# Patient Record
Sex: Male | Born: 1950 | Marital: Married
Health system: Southern US, Community
[De-identification: ages and names within clinical notes are randomized; demographics above are authoritative.]

---

## 2012-05-17 ENCOUNTER — Inpatient Hospital Stay: Payer: Self-pay | Admitting: Internal Medicine

## 2012-05-17 LAB — COMPREHENSIVE METABOLIC PANEL
Albumin: 3.6 g/dL (ref 3.4–5.0)
Alkaline Phosphatase: 87 U/L (ref 50–136)
Bilirubin,Total: 1.1 mg/dL — ABNORMAL HIGH (ref 0.2–1.0)
Calcium, Total: 8.6 mg/dL (ref 8.5–10.1)
Co2: 21 mmol/L (ref 21–32)
EGFR (African American): >60
EGFR (Non-African Amer.): >60
Glucose: 152 mg/dL — ABNORMAL HIGH (ref 65–99)
SGOT(AST): 30 U/L (ref 15–37)
SGPT (ALT): 15 U/L (ref 12–78)

## 2012-05-17 LAB — CBC WITH DIFFERENTIAL/PLATELET
Basophil %: 0.2 %
Eosinophil %: 0 %
HCT: 44 % (ref 40.0–52.0)
HGB: 15.4 g/dL (ref 13.0–18.0)
Lymphocyte #: 0.2 10*3/uL — ABNORMAL LOW (ref 1.0–3.6)
Lymphocyte %: 1.9 %
MCV: 105 fL — ABNORMAL HIGH (ref 80–100)
Monocyte %: 2.4 %
Neutrophil #: 10.9 10*3/uL — ABNORMAL HIGH (ref 1.4–6.5)
RDW: 16.1 % — ABNORMAL HIGH (ref 11.5–14.5)
WBC: 11.4 10*3/uL — ABNORMAL HIGH (ref 3.8–10.6)

## 2012-05-17 LAB — LIPASE, BLOOD: Lipase: >3000 U/L (ref 73–393)

## 2012-05-18 LAB — CBC WITH DIFFERENTIAL/PLATELET
Basophil %: 0.6 %
Eosinophil %: 2.4 %
HCT: 38.7 % — ABNORMAL LOW (ref 40.0–52.0)
HGB: 13 g/dL (ref 13.0–18.0)
Lymphocyte %: 5.7 %
MCH: 36.2 pg — ABNORMAL HIGH (ref 26.0–34.0)
MCHC: 33.7 g/dL (ref 32.0–36.0)
MCV: 107 fL — ABNORMAL HIGH (ref 80–100)
Neutrophil #: 10.7 10*3/uL — ABNORMAL HIGH (ref 1.4–6.5)
RBC: 3.6 10*6/uL — ABNORMAL LOW (ref 4.40–5.90)
WBC: 12.1 10*3/uL — ABNORMAL HIGH (ref 3.8–10.6)

## 2012-05-18 LAB — CEA: CEA: 6.3 ng/mL — ABNORMAL HIGH (ref 0.0–4.7)

## 2012-05-18 LAB — BASIC METABOLIC PANEL
Anion Gap: 7 (ref 7–16)
BUN: 5 mg/dL — ABNORMAL LOW (ref 7–18)
Calcium, Total: 7.4 mg/dL — ABNORMAL LOW (ref 8.5–10.1)
Chloride: 109 mmol/L — ABNORMAL HIGH (ref 98–107)
Creatinine: 0.74 mg/dL (ref 0.60–1.30)
EGFR (African American): >60
Osmolality: 281 (ref 275–301)

## 2012-05-18 LAB — MAGNESIUM: Magnesium: 1.4 mg/dL — ABNORMAL LOW

## 2012-05-18 LAB — LIPASE, BLOOD: Lipase: 874 U/L — ABNORMAL HIGH (ref 73–393)

## 2012-05-19 LAB — HEPATIC FUNCTION PANEL A (ARMC)
Albumin: 2.5 g/dL — ABNORMAL LOW (ref 3.4–5.0)
Bilirubin, Direct: 0.3 mg/dL — ABNORMAL HIGH (ref 0.00–0.20)
Bilirubin,Total: 0.9 mg/dL (ref 0.2–1.0)
SGOT(AST): 23 U/L (ref 15–37)
Total Protein: 5.7 g/dL — ABNORMAL LOW (ref 6.4–8.2)

## 2012-05-19 LAB — LIPASE, BLOOD: Lipase: 165 U/L (ref 73–393)

## 2012-05-20 LAB — LIPASE, BLOOD: Lipase: 73 U/L (ref 73–393)

## 2012-05-23 LAB — CULTURE, BLOOD (SINGLE)

## 2012-05-25 LAB — PATHOLOGY REPORT

## 2012-07-20 ENCOUNTER — Emergency Department: Payer: Self-pay | Admitting: Emergency Medicine

## 2012-07-20 LAB — CBC
HCT: 44.2 %
HGB: 15.3 g/dL
MCH: 34.6 pg — ABNORMAL HIGH
MCHC: 34.6 g/dL
MCV: 100 fL
Platelet: 260 10*3/uL
RBC: 4.41 x10 6/mm 3
RDW: 15.3 % — ABNORMAL HIGH
WBC: 7 10*3/uL

## 2012-07-20 LAB — BASIC METABOLIC PANEL WITH GFR
Anion Gap: 10
BUN: 6 mg/dL — ABNORMAL LOW
Calcium, Total: 7.4 mg/dL — ABNORMAL LOW
Chloride: 103 mmol/L
Co2: 20 mmol/L — ABNORMAL LOW
Creatinine: 0.62 mg/dL
EGFR (African American): >60
EGFR (Non-African Amer.): >60
Glucose: 91 mg/dL
Osmolality: 264
Potassium: 3 mmol/L — ABNORMAL LOW
Sodium: 133 mmol/L — ABNORMAL LOW

## 2012-07-20 LAB — COMPREHENSIVE METABOLIC PANEL WITH GFR
Albumin: 3.7 g/dL
Alkaline Phosphatase: 116 U/L
Anion Gap: 12
BUN: 8 mg/dL
Bilirubin,Total: 1.8 mg/dL — ABNORMAL HIGH
Calcium, Total: 9 mg/dL
Chloride: 92 mmol/L — ABNORMAL LOW
Co2: 21 mmol/L
Creatinine: 0.72 mg/dL
EGFR (African American): >60
EGFR (Non-African Amer.): >60
Glucose: 113 mg/dL — ABNORMAL HIGH
Osmolality: 251
Potassium: 3.2 mmol/L — ABNORMAL LOW
SGOT(AST): 117 U/L — ABNORMAL HIGH
SGPT (ALT): 68 U/L
Sodium: 125 mmol/L — ABNORMAL LOW
Total Protein: 7.6 g/dL

## 2012-07-20 LAB — LIPASE, BLOOD: Lipase: 257 U/L (ref 73–393)

## 2012-07-20 LAB — URINALYSIS, COMPLETE
Bilirubin,UR: NEGATIVE
Blood: NEGATIVE
Hyaline Cast: 28
Leukocyte Esterase: NEGATIVE
Ph: 6 (ref 4.5–8.0)
Protein: 30
RBC,UR: 1 /HPF (ref 0–5)

## 2012-07-27 ENCOUNTER — Ambulatory Visit: Payer: Self-pay | Admitting: Anesthesiology

## 2012-08-05 ENCOUNTER — Other Ambulatory Visit: Payer: Self-pay | Admitting: Surgery

## 2012-08-05 LAB — COMPREHENSIVE METABOLIC PANEL
Albumin: 3.7 g/dL (ref 3.4–5.0)
BUN: 6 mg/dL — ABNORMAL LOW (ref 7–18)
Calcium, Total: 8.8 mg/dL (ref 8.5–10.1)
Chloride: 107 mmol/L (ref 98–107)
Creatinine: 0.61 mg/dL (ref 0.60–1.30)
EGFR (African American): >60
EGFR (Non-African Amer.): >60
SGOT(AST): 75 U/L — ABNORMAL HIGH (ref 15–37)
SGPT (ALT): 40 U/L (ref 12–78)
Sodium: 138 mmol/L (ref 136–145)
Total Protein: 7.5 g/dL (ref 6.4–8.2)

## 2012-08-05 LAB — LIPASE, BLOOD: Lipase: 1123 U/L — ABNORMAL HIGH (ref 73–393)

## 2012-08-05 LAB — PHOSPHORUS: Phosphorus: 3.1 mg/dL (ref 2.5–4.9)

## 2012-08-28 ENCOUNTER — Ambulatory Visit: Payer: Self-pay | Admitting: Anesthesiology

## 2012-08-28 ENCOUNTER — Other Ambulatory Visit: Payer: Self-pay | Admitting: Gastroenterology

## 2012-08-28 LAB — WBCS, STOOL

## 2012-08-28 LAB — CLOSTRIDIUM DIFFICILE BY PCR

## 2012-08-30 LAB — STOOL CULTURE

## 2012-09-11 ENCOUNTER — Ambulatory Visit: Payer: Self-pay | Admitting: Gastroenterology

## 2012-09-14 LAB — PATHOLOGY REPORT

## 2012-11-17 ENCOUNTER — Inpatient Hospital Stay: Payer: Self-pay | Admitting: Internal Medicine

## 2012-11-17 LAB — COMPREHENSIVE METABOLIC PANEL
Alkaline Phosphatase: 240 U/L — ABNORMAL HIGH (ref 50–136)
BUN: 10 mg/dL (ref 7–18)
Bilirubin,Total: 3 mg/dL — ABNORMAL HIGH (ref 0.2–1.0)
Calcium, Total: 7.2 mg/dL — ABNORMAL LOW (ref 8.5–10.1)
Chloride: 92 mmol/L — ABNORMAL LOW (ref 98–107)
Creatinine: 0.92 mg/dL (ref 0.60–1.30)
EGFR (Non-African Amer.): >60
Potassium: 2.8 mmol/L — ABNORMAL LOW (ref 3.5–5.1)
SGOT(AST): 61 U/L — ABNORMAL HIGH (ref 15–37)
SGPT (ALT): 44 U/L (ref 12–78)

## 2012-11-17 LAB — URINALYSIS, COMPLETE
Bacteria: NONE SEEN
Bilirubin,UR: NEGATIVE
Glucose,UR: NEGATIVE mg/dL (ref 0–75)
Hyaline Cast: 30
Ketone: NEGATIVE
Leukocyte Esterase: NEGATIVE
Nitrite: NEGATIVE
Protein: NEGATIVE
RBC,UR: 3 /HPF (ref 0–5)
Squamous Epithelial: 1

## 2012-11-17 LAB — CBC
MCH: 38.9 pg — ABNORMAL HIGH (ref 26.0–34.0)
MCV: 116 fL — ABNORMAL HIGH (ref 80–100)
Platelet: 279 10*3/uL (ref 150–440)
RDW: 20 % — ABNORMAL HIGH (ref 11.5–14.5)
WBC: 26.8 10*3/uL — ABNORMAL HIGH (ref 3.8–10.6)

## 2012-11-17 LAB — ACETAMINOPHEN LEVEL: Acetaminophen: 29 ug/mL

## 2012-11-17 LAB — LIPASE, BLOOD: Lipase: 1417 U/L — ABNORMAL HIGH (ref 73–393)

## 2012-11-18 DIAGNOSIS — R0602 Shortness of breath: Secondary | ICD-10-CM

## 2012-11-18 LAB — CBC WITH DIFFERENTIAL/PLATELET
Basophil %: 0.2 %
Eosinophil #: 0.3 10*3/uL (ref 0.0–0.7)
Lymphocyte #: 1.1 10*3/uL (ref 1.0–3.6)
MCH: 39.3 pg — ABNORMAL HIGH (ref 26.0–34.0)
MCHC: 33.6 g/dL (ref 32.0–36.0)
MCV: 117 fL — ABNORMAL HIGH (ref 80–100)
Monocyte #: 0.6 x10 3/mm (ref 0.2–1.0)
Neutrophil #: 21.8 10*3/uL — ABNORMAL HIGH (ref 1.4–6.5)
Neutrophil %: 91.3 %
Platelet: 236 10*3/uL (ref 150–440)
RDW: 20.1 % — ABNORMAL HIGH (ref 11.5–14.5)
WBC: 23.9 10*3/uL — ABNORMAL HIGH (ref 3.8–10.6)

## 2012-11-18 LAB — COMPREHENSIVE METABOLIC PANEL
Alkaline Phosphatase: 219 U/L — ABNORMAL HIGH (ref 50–136)
Anion Gap: 8 (ref 7–16)
BUN: 11 mg/dL (ref 7–18)
Bilirubin,Total: 2.5 mg/dL — ABNORMAL HIGH (ref 0.2–1.0)
Calcium, Total: 6.4 mg/dL — CL (ref 8.5–10.1)
Chloride: 98 mmol/L (ref 98–107)
Co2: 24 mmol/L (ref 21–32)
Creatinine: 1.27 mg/dL (ref 0.60–1.30)
EGFR (African American): >60
EGFR (Non-African Amer.): >60
SGOT(AST): 50 U/L — ABNORMAL HIGH (ref 15–37)
SGPT (ALT): 39 U/L (ref 12–78)
Sodium: 130 mmol/L — ABNORMAL LOW (ref 136–145)

## 2012-11-19 DIAGNOSIS — I959 Hypotension, unspecified: Secondary | ICD-10-CM

## 2012-11-19 LAB — BASIC METABOLIC PANEL
Anion Gap: 10 (ref 7–16)
BUN: 11 mg/dL (ref 7–18)
Creatinine: 1.27 mg/dL (ref 0.60–1.30)
EGFR (African American): >60
EGFR (Non-African Amer.): >60
Glucose: 48 mg/dL — ABNORMAL LOW (ref 65–99)
Potassium: 4.2 mmol/L (ref 3.5–5.1)

## 2012-11-19 LAB — CBC WITH DIFFERENTIAL/PLATELET
Bands: 4 %
HCT: 29 % — ABNORMAL LOW (ref 40.0–52.0)
HGB: 9.2 g/dL — ABNORMAL LOW (ref 13.0–18.0)
Lymphocytes: 4 %
MCHC: 31.7 g/dL — ABNORMAL LOW (ref 32.0–36.0)
MCV: 123 fL — ABNORMAL HIGH (ref 80–100)
Monocytes: 6 %
NRBC/100 WBC: 1 /
RBC: 2.35 10*6/uL — ABNORMAL LOW (ref 4.40–5.90)
Segmented Neutrophils: 86 %
WBC: 17.9 10*3/uL — ABNORMAL HIGH (ref 3.8–10.6)

## 2012-11-19 LAB — PRO B NATRIURETIC PEPTIDE: B-Type Natriuretic Peptide: 957 pg/mL — ABNORMAL HIGH (ref 0–125)

## 2012-11-19 LAB — HEPATIC FUNCTION PANEL A (ARMC)
SGOT(AST): 51 U/L — ABNORMAL HIGH (ref 15–37)
Total Protein: 4.2 g/dL — ABNORMAL LOW (ref 6.4–8.2)

## 2012-11-19 LAB — MAGNESIUM: Magnesium: 2.1 mg/dL

## 2012-11-19 LAB — TROPONIN I: Troponin-I: <0.02 ng/mL

## 2012-11-19 LAB — LIPASE, BLOOD: Lipase: 270 U/L (ref 73–393)

## 2012-11-19 LAB — CK TOTAL AND CKMB (NOT AT ARMC): CK-MB: 3.4 ng/mL (ref 0.5–3.6)

## 2012-11-20 LAB — BASIC METABOLIC PANEL
Anion Gap: 6 — ABNORMAL LOW (ref 7–16)
BUN: 13 mg/dL (ref 7–18)
Calcium, Total: 6.3 mg/dL — CL (ref 8.5–10.1)
Chloride: 106 mmol/L (ref 98–107)
Co2: 21 mmol/L (ref 21–32)
EGFR (Non-African Amer.): 60 — ABNORMAL LOW
Glucose: 115 mg/dL — ABNORMAL HIGH (ref 65–99)
Potassium: 3.8 mmol/L (ref 3.5–5.1)
Sodium: 133 mmol/L — ABNORMAL LOW (ref 136–145)

## 2012-11-20 LAB — CBC WITH DIFFERENTIAL/PLATELET
Bands: 5 %
HCT: 27.7 % — ABNORMAL LOW (ref 40.0–52.0)
HGB: 9.1 g/dL — ABNORMAL LOW (ref 13.0–18.0)
MCH: 40.1 pg — ABNORMAL HIGH (ref 26.0–34.0)
MCHC: 32.7 g/dL (ref 32.0–36.0)
MCV: 123 fL — ABNORMAL HIGH (ref 80–100)
Metamyelocyte: 2 %
Monocytes: 2 %
Platelet: 235 10*3/uL (ref 150–440)
RDW: 20.5 % — ABNORMAL HIGH (ref 11.5–14.5)
Segmented Neutrophils: 86 %

## 2012-11-20 LAB — TROPONIN I
Troponin-I: <0.02 ng/mL
Troponin-I: <0.02 ng/mL

## 2012-11-20 LAB — CK TOTAL AND CKMB (NOT AT ARMC)
CK, Total: 57 U/L (ref 35–232)
CK, Total: 83 U/L (ref 35–232)
CK-MB: 1.6 ng/mL (ref 0.5–3.6)

## 2012-11-20 LAB — PHOSPHORUS: Phosphorus: 2.6 mg/dL (ref 2.5–4.9)

## 2012-11-20 LAB — CANCER ANTIGEN 19-9: CA 19-9: 111 U/mL — ABNORMAL HIGH (ref 0–35)

## 2012-11-21 LAB — CBC WITH DIFFERENTIAL/PLATELET
Basophil #: 0 10*3/uL (ref 0.0–0.1)
Basophil %: 0.1 %
Eosinophil #: 0 10*3/uL (ref 0.0–0.7)
HCT: 29.4 % — ABNORMAL LOW (ref 40.0–52.0)
HGB: 9.6 g/dL — ABNORMAL LOW (ref 13.0–18.0)
Lymphocyte #: 0.7 10*3/uL — ABNORMAL LOW (ref 1.0–3.6)
MCV: 122 fL — ABNORMAL HIGH (ref 80–100)
Monocyte #: 0.8 x10 3/mm (ref 0.2–1.0)
Monocyte %: 4.2 %
Neutrophil %: 92.4 %
Platelet: 241 10*3/uL (ref 150–440)
RDW: 21.7 % — ABNORMAL HIGH (ref 11.5–14.5)
WBC: 20.3 10*3/uL — ABNORMAL HIGH (ref 3.8–10.6)

## 2012-11-21 LAB — COMPREHENSIVE METABOLIC PANEL
Albumin: 1.1 g/dL — ABNORMAL LOW (ref 3.4–5.0)
Alkaline Phosphatase: 173 U/L — ABNORMAL HIGH (ref 50–136)
Anion Gap: 7 (ref 7–16)
BUN: 12 mg/dL (ref 7–18)
Bilirubin,Total: 1.7 mg/dL — ABNORMAL HIGH (ref 0.2–1.0)
Chloride: 106 mmol/L (ref 98–107)
EGFR (African American): >60
EGFR (Non-African Amer.): >60
Osmolality: 272 (ref 275–301)
Potassium: 4.2 mmol/L (ref 3.5–5.1)
SGOT(AST): 37 U/L (ref 15–37)
SGPT (ALT): 26 U/L (ref 12–78)
Sodium: 135 mmol/L — ABNORMAL LOW (ref 136–145)

## 2012-11-21 LAB — TRIGLYCERIDES: Triglycerides: 191 mg/dL (ref 0–200)

## 2012-11-22 LAB — BASIC METABOLIC PANEL
BUN: 13 mg/dL (ref 7–18)
Co2: 23 mmol/L (ref 21–32)
EGFR (African American): >60
EGFR (Non-African Amer.): >60
Osmolality: 274 (ref 275–301)
Potassium: 3.4 mmol/L — ABNORMAL LOW (ref 3.5–5.1)
Sodium: 137 mmol/L (ref 136–145)

## 2012-11-22 LAB — CBC WITH DIFFERENTIAL/PLATELET
Bands: 3 %
HCT: 24.2 % — ABNORMAL LOW (ref 40.0–52.0)
MCHC: 33.1 g/dL (ref 32.0–36.0)
Metamyelocyte: 1 %
Platelet: 185 10*3/uL (ref 150–440)

## 2012-11-22 LAB — POTASSIUM: Potassium: 4 mmol/L (ref 3.5–5.1)

## 2012-11-23 LAB — BASIC METABOLIC PANEL
BUN: 21 mg/dL — ABNORMAL HIGH (ref 7–18)
Calcium, Total: 7.4 mg/dL — ABNORMAL LOW (ref 8.5–10.1)
Chloride: 106 mmol/L (ref 98–107)
Co2: 25 mmol/L (ref 21–32)
Creatinine: 0.63 mg/dL (ref 0.60–1.30)
EGFR (African American): >60
EGFR (Non-African Amer.): >60
Osmolality: 279 (ref 275–301)
Potassium: 3.8 mmol/L (ref 3.5–5.1)
Sodium: 138 mmol/L (ref 136–145)

## 2012-11-23 LAB — CBC WITH DIFFERENTIAL/PLATELET
Eosinophil: 1 %
HCT: 26.5 % — ABNORMAL LOW (ref 40.0–52.0)
HGB: 8.8 g/dL — ABNORMAL LOW (ref 13.0–18.0)
MCHC: 33.1 g/dL (ref 32.0–36.0)
Metamyelocyte: 1 %
Platelet: 216 10*3/uL (ref 150–440)
RBC: 2.18 10*6/uL — ABNORMAL LOW (ref 4.40–5.90)
Segmented Neutrophils: 87 %
WBC: 13 10*3/uL — ABNORMAL HIGH (ref 3.8–10.6)

## 2012-11-23 LAB — HEPATIC FUNCTION PANEL A (ARMC)
Albumin: 1.5 g/dL — ABNORMAL LOW (ref 3.4–5.0)
Bilirubin, Direct: 0.9 mg/dL — ABNORMAL HIGH (ref 0.00–0.20)
SGPT (ALT): 36 U/L (ref 12–78)
Total Protein: 4.7 g/dL — ABNORMAL LOW (ref 6.4–8.2)

## 2012-11-23 LAB — CULTURE, BLOOD (SINGLE)

## 2012-11-24 LAB — CBC WITH DIFFERENTIAL/PLATELET
Bands: 1 %
HCT: 27 % — ABNORMAL LOW (ref 40.0–52.0)
MCH: 39.4 pg — ABNORMAL HIGH (ref 26.0–34.0)
MCHC: 32.5 g/dL (ref 32.0–36.0)
MCV: 121 fL — ABNORMAL HIGH (ref 80–100)
Monocytes: 6 %
Platelet: 211 10*3/uL (ref 150–440)
RDW: 20.7 % — ABNORMAL HIGH (ref 11.5–14.5)
Segmented Neutrophils: 87 %
WBC: 12.3 10*3/uL — ABNORMAL HIGH (ref 3.8–10.6)

## 2012-11-24 LAB — TRIGLYCERIDES: Triglycerides: 217 mg/dL — ABNORMAL HIGH (ref 0–200)

## 2012-11-24 LAB — BASIC METABOLIC PANEL
Anion Gap: 8 (ref 7–16)
Co2: 25 mmol/L (ref 21–32)
EGFR (African American): >60
Osmolality: 283 (ref 275–301)
Sodium: 139 mmol/L (ref 136–145)

## 2012-11-25 LAB — BASIC METABOLIC PANEL
BUN: 20 mg/dL — ABNORMAL HIGH (ref 7–18)
Calcium, Total: 7.4 mg/dL — ABNORMAL LOW (ref 8.5–10.1)
Chloride: 105 mmol/L (ref 98–107)
EGFR (African American): >60
EGFR (Non-African Amer.): >60
Sodium: 141 mmol/L (ref 136–145)

## 2012-11-25 LAB — URINALYSIS, COMPLETE
Bacteria: NONE SEEN
Bilirubin,UR: NEGATIVE
Blood: NEGATIVE
Glucose,UR: NEGATIVE mg/dL (ref 0–75)
Leukocyte Esterase: NEGATIVE
Nitrite: NEGATIVE
Specific Gravity: 1.014 (ref 1.003–1.030)
WBC UR: NONE SEEN /HPF (ref 0–5)

## 2012-11-25 LAB — CBC WITH DIFFERENTIAL/PLATELET
Basophil %: 0.5 %
Eosinophil #: 0 10*3/uL (ref 0.0–0.7)
HCT: 25 % — ABNORMAL LOW (ref 40.0–52.0)
HGB: 8.5 g/dL — ABNORMAL LOW (ref 13.0–18.0)
Lymphocyte #: 1 10*3/uL (ref 1.0–3.6)
Lymphocyte %: 4.6 %
MCH: 40.8 pg — ABNORMAL HIGH (ref 26.0–34.0)
MCHC: 33.9 g/dL (ref 32.0–36.0)
Monocyte #: 0.9 x10 3/mm (ref 0.2–1.0)
Monocyte %: 4.4 %
Platelet: 209 10*3/uL (ref 150–440)
RBC: 2.08 10*6/uL — ABNORMAL LOW (ref 4.40–5.90)
WBC: 21.2 10*3/uL — ABNORMAL HIGH (ref 3.8–10.6)

## 2012-11-26 LAB — CBC WITH DIFFERENTIAL/PLATELET
Basophil #: 0.1 10*3/uL (ref 0.0–0.1)
Eosinophil #: 0.1 10*3/uL (ref 0.0–0.7)
Eosinophil %: 0.3 %
HCT: 28.1 % — ABNORMAL LOW (ref 40.0–52.0)
Lymphocyte #: 1.7 10*3/uL (ref 1.0–3.6)
MCHC: 33.2 g/dL (ref 32.0–36.0)
MCV: 120 fL — ABNORMAL HIGH (ref 80–100)
Neutrophil #: 24.4 10*3/uL — ABNORMAL HIGH (ref 1.4–6.5)
Neutrophil %: 89.1 %
RBC: 2.34 10*6/uL — ABNORMAL LOW (ref 4.40–5.90)
WBC: 27.4 10*3/uL — ABNORMAL HIGH (ref 3.8–10.6)

## 2012-11-26 LAB — ALBUMIN: Albumin: 2 g/dL — ABNORMAL LOW (ref 3.4–5.0)

## 2012-11-26 LAB — BASIC METABOLIC PANEL
Anion Gap: 6 — ABNORMAL LOW (ref 7–16)
Calcium, Total: 7.5 mg/dL — ABNORMAL LOW (ref 8.5–10.1)
EGFR (African American): >60
EGFR (Non-African Amer.): >60
Glucose: 103 mg/dL — ABNORMAL HIGH (ref 65–99)
Osmolality: 282 (ref 275–301)
Potassium: 2.8 mmol/L — ABNORMAL LOW (ref 3.5–5.1)

## 2012-11-27 LAB — CBC WITH DIFFERENTIAL/PLATELET
HCT: 26.2 % — ABNORMAL LOW (ref 40.0–52.0)
Lymphocyte #: 1.9 10*3/uL (ref 1.0–3.6)
Lymphocyte %: 8.5 %
Monocyte #: 0.8 x10 3/mm (ref 0.2–1.0)
Neutrophil %: 86.1 %
Platelet: 206 10*3/uL (ref 150–440)
RDW: 19.1 % — ABNORMAL HIGH (ref 11.5–14.5)
WBC: 22.8 10*3/uL — ABNORMAL HIGH (ref 3.8–10.6)

## 2012-11-27 LAB — BASIC METABOLIC PANEL
BUN: 9 mg/dL (ref 7–18)
Calcium, Total: 7.5 mg/dL — ABNORMAL LOW (ref 8.5–10.1)
Chloride: 101 mmol/L (ref 98–107)
Co2: 35 mmol/L — ABNORMAL HIGH (ref 21–32)
Creatinine: 0.43 mg/dL — ABNORMAL LOW (ref 0.60–1.30)
EGFR (African American): >60
EGFR (Non-African Amer.): >60
Potassium: 3.7 mmol/L (ref 3.5–5.1)
Sodium: 142 mmol/L (ref 136–145)

## 2012-11-27 LAB — PHOSPHORUS: Phosphorus: 1.6 mg/dL — ABNORMAL LOW (ref 2.5–4.9)

## 2012-11-28 LAB — CBC WITH DIFFERENTIAL/PLATELET
Bands: 3 %
HCT: 25.6 % — ABNORMAL LOW (ref 40.0–52.0)
Lymphocytes: 4 %
MCH: 38.2 pg — ABNORMAL HIGH (ref 26.0–34.0)
MCV: 121 fL — ABNORMAL HIGH (ref 80–100)
Monocytes: 5 %
Myelocyte: 1 %
RDW: 19.2 % — ABNORMAL HIGH (ref 11.5–14.5)
Segmented Neutrophils: 83 %

## 2012-11-28 LAB — PHOSPHORUS: Phosphorus: 2.5 mg/dL

## 2012-11-29 LAB — PHOSPHORUS: Phosphorus: 2.6 mg/dL (ref 2.5–4.9)

## 2012-12-08 ENCOUNTER — Ambulatory Visit: Payer: Self-pay | Admitting: Internal Medicine

## 2012-12-22 ENCOUNTER — Inpatient Hospital Stay: Payer: Self-pay | Admitting: Internal Medicine

## 2012-12-22 LAB — CBC WITH DIFFERENTIAL/PLATELET
Basophil %: 0.5 %
Eosinophil %: 0 %
HCT: 26.3 % — ABNORMAL LOW (ref 40.0–52.0)
HGB: 8.8 g/dL — ABNORMAL LOW (ref 13.0–18.0)
Lymphocyte #: 1.6 10*3/uL (ref 1.0–3.6)
MCH: 37.2 pg — ABNORMAL HIGH (ref 26.0–34.0)
MCHC: 33.4 g/dL (ref 32.0–36.0)
MCV: 112 fL — ABNORMAL HIGH (ref 80–100)
Monocyte #: 1.4 x10 3/mm — ABNORMAL HIGH (ref 0.2–1.0)
Neutrophil #: 49.9 10*3/uL — ABNORMAL HIGH (ref 1.4–6.5)
RBC: 2.36 10*6/uL — ABNORMAL LOW (ref 4.40–5.90)
RDW: 17.2 % — ABNORMAL HIGH (ref 11.5–14.5)

## 2012-12-22 LAB — COMPREHENSIVE METABOLIC PANEL
Albumin: 1.1 g/dL — ABNORMAL LOW (ref 3.4–5.0)
Alkaline Phosphatase: 94 U/L (ref 50–136)
BUN: 9 mg/dL (ref 7–18)
Bilirubin,Total: 0.4 mg/dL (ref 0.2–1.0)
Calcium, Total: 6.4 mg/dL — CL (ref 8.5–10.1)
Creatinine: 0.96 mg/dL (ref 0.60–1.30)
EGFR (African American): >60
Glucose: 136 mg/dL — ABNORMAL HIGH (ref 65–99)
Osmolality: 262 (ref 275–301)
Potassium: 3.2 mmol/L — ABNORMAL LOW (ref 3.5–5.1)
SGOT(AST): 19 U/L (ref 15–37)
SGPT (ALT): 14 U/L (ref 12–78)

## 2012-12-22 LAB — APTT: Activated PTT: 38.1 secs — ABNORMAL HIGH (ref 23.6–35.9)

## 2012-12-22 LAB — PROTIME-INR
INR: 1.7
Prothrombin Time: 19.5 secs — ABNORMAL HIGH (ref 11.5–14.7)

## 2012-12-22 LAB — LIPASE, BLOOD: Lipase: 126 U/L (ref 73–393)

## 2012-12-23 LAB — BODY FLUID CELL COUNT WITH DIFFERENTIAL
Basophil: 0 %
Eosinophil: 0 %
Neutrophils: 92 %
Nucleated Cell Count: 706 /mm3
Other Mononuclear Cells: 5 %

## 2012-12-23 LAB — URINALYSIS, COMPLETE
Blood: NEGATIVE
Hyaline Cast: 18
Leukocyte Esterase: NEGATIVE
Nitrite: NEGATIVE
Squamous Epithelial: NONE SEEN
WBC UR: 1 /HPF (ref 0–5)

## 2012-12-23 LAB — CBC WITH DIFFERENTIAL/PLATELET
Bands: 8 %
HCT: 30.1 % — ABNORMAL LOW (ref 40.0–52.0)
HGB: 9.8 g/dL — ABNORMAL LOW (ref 13.0–18.0)
Lymphocytes: 2 %
MCV: 109 fL — ABNORMAL HIGH (ref 80–100)
Monocytes: 5 %
Platelet: 341 10*3/uL (ref 150–440)
RBC: 2.75 10*6/uL — ABNORMAL LOW (ref 4.40–5.90)
RDW: 17.1 % — ABNORMAL HIGH (ref 11.5–14.5)
Segmented Neutrophils: 85 %
WBC: 58.6 10*3/uL — ABNORMAL HIGH (ref 3.8–10.6)

## 2012-12-23 LAB — COMPREHENSIVE METABOLIC PANEL
Alkaline Phosphatase: 117 U/L (ref 50–136)
Anion Gap: 9 (ref 7–16)
BUN: 8 mg/dL (ref 7–18)
Bilirubin,Total: 0.5 mg/dL (ref 0.2–1.0)
Chloride: 96 mmol/L — ABNORMAL LOW (ref 98–107)
Co2: 25 mmol/L (ref 21–32)
Creatinine: 0.9 mg/dL (ref 0.60–1.30)
EGFR (African American): >60
Glucose: 70 mg/dL (ref 65–99)
Osmolality: 258 (ref 275–301)
Potassium: 3.6 mmol/L (ref 3.5–5.1)
SGPT (ALT): 16 U/L (ref 12–78)
Sodium: 130 mmol/L — ABNORMAL LOW (ref 136–145)
Total Protein: 4.3 g/dL — ABNORMAL LOW (ref 6.4–8.2)

## 2012-12-23 LAB — PROTEIN, BODY FLUID: Protein, Body Fluid: 1.2 g/dL

## 2012-12-23 LAB — LACTATE DEHYDROGENASE, PLEURAL OR PERITONEAL FLUID: LDH, Body Fluid: 88 U/L

## 2012-12-23 LAB — ALBUMIN, FLUID (OTHER): Body Fluid Albumin: <0.6 g/dL

## 2012-12-23 LAB — AMYLASE, BODY FLUID: Amylase, Body Fluid: 267 U/L

## 2012-12-24 LAB — VANCOMYCIN, TROUGH: Vancomycin, Trough: 12 ug/mL (ref 10–20)

## 2012-12-24 LAB — COMPREHENSIVE METABOLIC PANEL
Albumin: 2.3 g/dL — ABNORMAL LOW (ref 3.4–5.0)
Alkaline Phosphatase: 86 U/L (ref 50–136)
Anion Gap: 6 — ABNORMAL LOW (ref 7–16)
Bilirubin,Total: 0.7 mg/dL (ref 0.2–1.0)
Calcium, Total: 7.2 mg/dL — ABNORMAL LOW (ref 8.5–10.1)
EGFR (African American): >60
EGFR (Non-African Amer.): >60
Glucose: 77 mg/dL (ref 65–99)
SGOT(AST): 21 U/L (ref 15–37)
Sodium: 130 mmol/L — ABNORMAL LOW (ref 136–145)

## 2012-12-24 LAB — CBC WITH DIFFERENTIAL/PLATELET
Basophil #: 0.3 10*3/uL — ABNORMAL HIGH (ref 0.0–0.1)
Eosinophil %: 0.1 %
HCT: 23.6 % — ABNORMAL LOW (ref 40.0–52.0)
Lymphocyte %: 5.2 %
MCH: 36.5 pg — ABNORMAL HIGH (ref 26.0–34.0)
MCHC: 33.8 g/dL (ref 32.0–36.0)
MCV: 108 fL — ABNORMAL HIGH (ref 80–100)
Monocyte %: 4.1 %
Neutrophil #: 24.8 10*3/uL — ABNORMAL HIGH (ref 1.4–6.5)
Neutrophil %: 89.6 %
Platelet: 237 10*3/uL (ref 150–440)
WBC: 27.7 10*3/uL — ABNORMAL HIGH (ref 3.8–10.6)

## 2012-12-25 LAB — CBC WITH DIFFERENTIAL/PLATELET
Basophil #: 0.2 10*3/uL — ABNORMAL HIGH (ref 0.0–0.1)
Basophil %: 0.7 %
Eosinophil #: 0.1 10*3/uL (ref 0.0–0.7)
Eosinophil %: 0.3 %
HCT: 27 % — ABNORMAL LOW (ref 40.0–52.0)
HGB: 9 g/dL — ABNORMAL LOW (ref 13.0–18.0)
Lymphocyte #: 1.3 10*3/uL (ref 1.0–3.6)
MCH: 35.8 pg — ABNORMAL HIGH (ref 26.0–34.0)
Monocyte %: 3.2 %
Platelet: 209 10*3/uL (ref 150–440)
RBC: 2.51 10*6/uL — ABNORMAL LOW (ref 4.40–5.90)
WBC: 26.3 10*3/uL — ABNORMAL HIGH (ref 3.8–10.6)

## 2012-12-25 LAB — COMPREHENSIVE METABOLIC PANEL
Albumin: 1.8 g/dL — ABNORMAL LOW (ref 3.4–5.0)
Alkaline Phosphatase: 100 U/L (ref 50–136)
Anion Gap: 6 — ABNORMAL LOW (ref 7–16)
BUN: 4 mg/dL — ABNORMAL LOW (ref 7–18)
Bilirubin,Total: 1.1 mg/dL — ABNORMAL HIGH (ref 0.2–1.0)
Calcium, Total: 7.2 mg/dL — ABNORMAL LOW (ref 8.5–10.1)
Creatinine: 0.57 mg/dL — ABNORMAL LOW (ref 0.60–1.30)
Glucose: 81 mg/dL (ref 65–99)
Osmolality: 257 (ref 275–301)
SGOT(AST): 20 U/L (ref 15–37)
Sodium: 130 mmol/L — ABNORMAL LOW (ref 136–145)
Total Protein: 4.2 g/dL — ABNORMAL LOW (ref 6.4–8.2)

## 2012-12-25 LAB — CLOSTRIDIUM DIFFICILE BY PCR

## 2012-12-26 LAB — CBC WITH DIFFERENTIAL/PLATELET
Basophil #: 0.1 10*3/uL (ref 0.0–0.1)
Basophil %: 0.2 %
Lymphocyte #: 1.6 10*3/uL (ref 1.0–3.6)
Lymphocyte %: 7.1 %
MCHC: 34.3 g/dL (ref 32.0–36.0)
MCV: 107 fL — ABNORMAL HIGH (ref 80–100)
Monocyte #: 1.3 x10 3/mm — ABNORMAL HIGH (ref 0.2–1.0)
Monocyte %: 6 %
Neutrophil %: 86.1 %
Platelet: 181 10*3/uL (ref 150–440)
WBC: 22 10*3/uL — ABNORMAL HIGH (ref 3.8–10.6)

## 2012-12-26 LAB — BASIC METABOLIC PANEL
Anion Gap: 6 — ABNORMAL LOW (ref 7–16)
BUN: 2 mg/dL — ABNORMAL LOW (ref 7–18)
Calcium, Total: 7.2 mg/dL — ABNORMAL LOW (ref 8.5–10.1)
Chloride: 98 mmol/L (ref 98–107)
Creatinine: 0.46 mg/dL — ABNORMAL LOW (ref 0.60–1.30)
EGFR (African American): >60
Potassium: 3.3 mmol/L — ABNORMAL LOW (ref 3.5–5.1)
Sodium: 132 mmol/L — ABNORMAL LOW (ref 136–145)

## 2012-12-26 LAB — AMMONIA: Ammonia, Plasma: 41 mcmol/L — ABNORMAL HIGH (ref 11–32)

## 2012-12-27 LAB — CBC WITH DIFFERENTIAL/PLATELET
Basophil #: 0.1 10*3/uL (ref 0.0–0.1)
Eosinophil #: 0.2 10*3/uL (ref 0.0–0.7)
Eosinophil %: 0.9 %
HCT: 24.8 % — ABNORMAL LOW (ref 40.0–52.0)
HGB: 8.3 g/dL — ABNORMAL LOW (ref 13.0–18.0)
Lymphocyte #: 1.6 10*3/uL (ref 1.0–3.6)
MCV: 107 fL — ABNORMAL HIGH (ref 80–100)
Neutrophil #: 17.9 10*3/uL — ABNORMAL HIGH (ref 1.4–6.5)
Neutrophil %: 83.8 %
Platelet: 178 10*3/uL (ref 150–440)
WBC: 21.4 10*3/uL — ABNORMAL HIGH (ref 3.8–10.6)

## 2012-12-27 LAB — BASIC METABOLIC PANEL
BUN: 2 mg/dL — ABNORMAL LOW (ref 7–18)
Calcium, Total: 7.2 mg/dL — ABNORMAL LOW (ref 8.5–10.1)
Chloride: 99 mmol/L (ref 98–107)
EGFR (African American): >60
Glucose: 82 mg/dL (ref 65–99)
Osmolality: 264 (ref 275–301)

## 2012-12-27 LAB — BODY FLUID CULTURE

## 2012-12-28 LAB — CBC WITH DIFFERENTIAL/PLATELET
Basophil #: 0.1 10*3/uL (ref 0.0–0.1)
Basophil %: 0.3 %
Eosinophil #: 0.1 10*3/uL (ref 0.0–0.7)
HCT: 27 % — ABNORMAL LOW (ref 40.0–52.0)
HGB: 9.1 g/dL — ABNORMAL LOW (ref 13.0–18.0)
Lymphocyte #: 1.1 10*3/uL (ref 1.0–3.6)
Lymphocyte %: 5.5 %
Monocyte #: 1.3 x10 3/mm — ABNORMAL HIGH (ref 0.2–1.0)
Neutrophil #: 17.5 10*3/uL — ABNORMAL HIGH (ref 1.4–6.5)
Neutrophil %: 87.6 %
Platelet: 196 10*3/uL (ref 150–440)
RBC: 2.52 10*6/uL — ABNORMAL LOW (ref 4.40–5.90)
RDW: 18.2 % — ABNORMAL HIGH (ref 11.5–14.5)
WBC: 20 10*3/uL — ABNORMAL HIGH (ref 3.8–10.6)

## 2012-12-28 LAB — PROTIME-INR: Prothrombin Time: 18.9 secs — ABNORMAL HIGH (ref 11.5–14.7)

## 2012-12-28 LAB — CULTURE, BLOOD (SINGLE)

## 2012-12-29 LAB — BASIC METABOLIC PANEL
Anion Gap: 7 (ref 7–16)
BUN: 6 mg/dL — ABNORMAL LOW (ref 7–18)
Chloride: 98 mmol/L (ref 98–107)
Co2: 30 mmol/L (ref 21–32)
Creatinine: 0.95 mg/dL (ref 0.60–1.30)
EGFR (Non-African Amer.): >60
Glucose: 90 mg/dL (ref 65–99)
Sodium: 135 mmol/L — ABNORMAL LOW (ref 136–145)

## 2012-12-29 LAB — CBC WITH DIFFERENTIAL/PLATELET
Basophil #: 0.1 10*3/uL (ref 0.0–0.1)
Eosinophil #: 0.9 10*3/uL — ABNORMAL HIGH (ref 0.0–0.7)
Eosinophil %: 5.5 %
HGB: 8.9 g/dL — ABNORMAL LOW (ref 13.0–18.0)
Lymphocyte #: 1.1 10*3/uL (ref 1.0–3.6)
MCH: 35.7 pg — ABNORMAL HIGH (ref 26.0–34.0)
Neutrophil %: 81.2 %
Platelet: 232 10*3/uL (ref 150–440)
RBC: 2.48 10*6/uL — ABNORMAL LOW (ref 4.40–5.90)
RDW: 18 % — ABNORMAL HIGH (ref 11.5–14.5)
WBC: 15.8 10*3/uL — ABNORMAL HIGH (ref 3.8–10.6)

## 2012-12-29 LAB — PROTIME-INR: Prothrombin Time: 21.9 secs — ABNORMAL HIGH (ref 11.5–14.7)

## 2012-12-29 LAB — MAGNESIUM: Magnesium: 1 mg/dL — ABNORMAL LOW

## 2012-12-30 LAB — CBC WITH DIFFERENTIAL/PLATELET
Basophil %: 1.3 %
Eosinophil %: 0.6 %
Lymphocyte #: 1.4 10*3/uL (ref 1.0–3.6)
Lymphocyte %: 7 %
MCHC: 33.4 g/dL (ref 32.0–36.0)
Monocyte %: 5.6 %
Platelet: 262 10*3/uL (ref 150–440)
RDW: 18.3 % — ABNORMAL HIGH (ref 11.5–14.5)
WBC: 19.9 10*3/uL — ABNORMAL HIGH (ref 3.8–10.6)

## 2012-12-30 LAB — POTASSIUM: Potassium: 3.4 mmol/L — ABNORMAL LOW (ref 3.5–5.1)

## 2012-12-30 LAB — HEPATIC FUNCTION PANEL A (ARMC)
Alkaline Phosphatase: 88 U/L (ref 50–136)
Bilirubin,Total: 1.1 mg/dL — ABNORMAL HIGH (ref 0.2–1.0)
SGOT(AST): 23 U/L (ref 15–37)

## 2012-12-30 LAB — MAGNESIUM: Magnesium: 1.8 mg/dL

## 2012-12-30 LAB — CLOSTRIDIUM DIFFICILE BY PCR

## 2012-12-31 LAB — CBC WITH DIFFERENTIAL/PLATELET
Basophil #: 0.1 10*3/uL (ref 0.0–0.1)
Basophil %: 0.4 %
HGB: 8 g/dL — ABNORMAL LOW (ref 13.0–18.0)
MCH: 35.4 pg — ABNORMAL HIGH (ref 26.0–34.0)
MCV: 107 fL — ABNORMAL HIGH (ref 80–100)
Monocyte %: 4.9 %
Neutrophil #: 19.7 10*3/uL — ABNORMAL HIGH (ref 1.4–6.5)
Neutrophil %: 87.1 %
RDW: 18.9 % — ABNORMAL HIGH (ref 11.5–14.5)
WBC: 22.6 10*3/uL — ABNORMAL HIGH (ref 3.8–10.6)

## 2013-01-07 ENCOUNTER — Ambulatory Visit: Payer: Self-pay | Admitting: Internal Medicine

## 2013-02-07 DEATH — deceased

## 2014-12-27 NOTE — Consult Note (Signed)
CC: pancreatitis.Pt still with discomfort but feels better today than on admission.  Abd less tender, Lipase normal, albumin 2.5, VSS afebrile. Last colon was 2 years ago. His rapid onset of symptoms and rapid improvement seems usually present with stone disease.  Alcohol may be a factor but pure alcohol pancreatitis usually takes longer to improve. Recommend colonoscopy when recovered from surgery for gall bladder.    Electronic Signatures: Scot JunElliott, Elga Santy T (MD)  (Signed on 10-Sep-13 11:36)  Authored  Last Updated: 10-Sep-13 11:36 by Scot JunElliott, Uchenna Rappaport T (MD)

## 2014-12-27 NOTE — Consult Note (Signed)
PATIENT NAME:  Craig JakschSECORD, Rayshun S MR#:  [id number] DATE OF BIRTH:  18-May-1951  DATE OF CONSULTATION:  05/17/2012  REFERRING PHYSICIAN:  Enid Baasadhika Kalisetti, MD CONSULTING PHYSICIAN:  Scot Junobert T. Buena Boehm, MD  HISTORY OF PRESENT ILLNESS: The patient is a 64 year old white male who was admitted with pancreatitis. He was in his usual health and yesterday morning woke up, had nausea, epigastric and periumbilical abdominal pain, vomiting greenish-yellow stuff, and had dry heaves. He came to the hospital and was found to have a lipase greater than 3000. CT scan showed peripancreatic inflammation and also gallstones and he was admitted to the hospital for acute pancreatitis.   PAST MEDICAL HISTORY:  1. The patient had a colon resection five years ago. The first surgery took out 18 inches for Crohn's disease. He had relapsed and had surgery nine days later and they found colon cancer. That was five years ago. He has had multiple colonoscopies since then and is on a yearly scheduled to get checked.  2. Chemotherapy-related peripheral neuropathy.   PAST SURGICAL HISTORY: 1. Knee cartilage surgery.  2. Colon resection times two.  HOME MEDICATIONS: 1. Celexa 40 mg a day.  2. Prilosec 20 mg a day.  3. Tylenol [phone] mg t.i.d. p.r.n.   HABITS: Smokes 1/2 pack a day. Drinks beer, two or more a day. He had been drinking more lately because of stress from his son in the hospital in [place].   FAMILY HISTORY: Mother with lung cancer. Father died of heart disease at age 63.   REVIEW OF SYSTEMS: RESPIRATORY: No cough, wheezing, or spitting up blood. CARDIOVASCULAR: No chest pain. No arrhythmias. No palpitations. GI: Crohn's disease has been active off and on the past. His last admission was about a year ago. He was on prednisone at that time. He was put on Entocort but because of the high cost he could not afford it and is currently off any medicine for Crohn's disease. He has had a pneumonia shot in the past.  GU:  No dysuria, hematuria, or kidney stones. PSYCHIATRIC: Some stress from family illness.   PHYSICAL EXAMINATION:  GENERAL: White male with a beard, ruddy complexion.   VITAL SIGNS: Temperature 98.6, pulse 94, respirations 18, blood pressure 188/93, pulse oximetry 94%.   HEENT: Sclerae anicteric. Conjunctivae- conjunctival hemorrhage on the left eye medial surface.   CHEST: Clear.   HEART: No murmurs or gallops I can hear.   ABDOMEN: Diffusely tender. There are bowel sounds present. No palpable masses.   EXTREMITIES: No edema.   SKIN: Warm and dry.   PSYCH: Mood affect are appropriate.   LABORATORY:  Glucose 152, BUN 9, creatinine 1.05, sodium 138, potassium 3.6, chloride 102, CO2 21, calcium 8.6, lipase greater than 3000. Total protein 7.6, albumin 3.6, total bilirubin 1.1, alkaline phosphatase 87, SGOT 30, SGPT 15. White count 11.4, hemoglobin 15.4, hematocrit 44, platelet count 342. Ultrasound of the abdomen shows fluid adjacent to the liver and the gallbladder and gallstones are present. No evidence of acute cholecystitis. CT scan of the abdomen shows increased density in the peripancreatic fat extending along the duodenum suspicious for acute pancreatitis. Abnormal appearance of the right colon and paracolic soft tissues on the right. Can't exclude colitis. Probably due to previous surgery. Crohn's flare or neoplasm cannot be excluded. The gallbladder shows a large calcified gallstone.   ASSESSMENT: Pancreatitis, probably gallstone pancreatitis. Cannot rule out some effect of alcohol.   RECOMMENDATIONS:  1. We will start a PCA pump.  2. Increase hydration  to 200 mL/h.  3. Recommend surgical consultation for possible gallbladder removal when his pancreas cools down. 4. Get serial liver functions and lipase.   We will follow with you.    ____________________________ Scot Jun, MD rte:bjt D: 05/17/2012 17:22:57 ET T: 05/18/2012 05:36:57 ET JOB#: [id number]  cc: Scot Jun, MD, <Dictator> Scot Jun MD ELECTRONICALLY SIGNED 05/19/2012 14:00

## 2014-12-27 NOTE — Consult Note (Signed)
CC: gall stone pancreatitis.  Operative note reviewed.  Radiologist read the intraoperative cholangiogram as showing narrowing of the distal duct.  this is likely due to pancreatitis residual effect.  Would wait 2-3 weeks for any MRCP if it is done specificly  for this narrowing.  Sclerosing cholangitis is associated with Crohn's disease but this would have been seen on the cholangiogram if it were present.  We will be glad to see him in follow up for his Crohn's disease or [facility].  I will sign off.  Electronic Signatures: Scot JunElliott, Celena Lanius T (MD)  (Signed on 12-Sep-13 16:10)  Authored  Last Updated: 12-Sep-13 16:10 by Scot JunElliott, Malka Bocek T (MD)

## 2014-12-27 NOTE — Consult Note (Signed)
PATIENT NAME:  Craig Mitchell, Craig Mitchell MR#:  [id number] DATE OF BIRTH:  1951/07/10  DATE OF CONSULTATION:  05/18/2012  REFERRING PHYSICIAN:   CONSULTING PHYSICIAN:  Keifer Habib A. Stefen Juba, MD  REASON FOR CONSULTATION: Pancreatitis.   HISTORY OF PRESENT ILLNESS: Mr. Craig Mitchell is a pleasant 64 year old male who presents with a history of colon cancer and Crohn'Mitchell status post resection and chemotherapy, who has one day of nausea, vomiting, and severe abdominal pain. He says he woke up and later in the afternoon, he had vomiting following his severe epigastric and periumbilical pain. He had a lipase drawn which showed greater than 3,000 and had an ultrasound which showed gallstones. He also had a CT, incidentally, that showed thickening of his right colon. He, otherwise, has no fevers, chills, night sweats, shortness of breath, chest pain, cough, diarrhea, constipation, dysuria, or hematuria.   PAST MEDICAL HISTORY:  1. History of colon cancer.  2. Status post colectomy and chemotherapy.  3. Crohn'Mitchell disease.  4. Peripheral nephropathy.  5. Knee surgery.   ALLERGIES: No known drug allergies.   HOME MEDICATIONS:  1. Celexa 40 mg p.o. daily.  2. Prilosec 20 mg p.o. daily.  3. Tylenol 500 to 1000 p.o. t.i.d. p.r.n. pain.   SOCIAL HISTORY: He lives at home with his wife and his daughter. He smokes about 1/2 pack a day. According to doctor'Mitchell note, it says that he drinks daily.   FAMILY HISTORY: Mom with lung cancer and father with heart disease.   REVIEW OF SYSTEMS: A total of 12 point review of systems was obtained.  Pertinent positives and negatives are noted in HPI.   PHYSICAL EXAMINATION:  VITAL SIGNS: Temperature 97.5, pulse 91, blood pressure 146/80, respirations 18, sats 96%.   GENERAL: No acute distress, alert and oriented x3, but is very somnolent.   HEAD: Normocephalic, atraumatic.   EYES: No jaundice. No scleral icterus.   NECK: No masses. No swelling.   CHEST: Lungs clear to  auscultation. Moving air well.   ABDOMEN: Soft, minimally tender, nondistended.   EXTREMITIES: Moves all extremities well. Strength five out of five.   NEUROLOGIC: Cranial nerves II through XII grossly intact. Sensation intact to all four extremities.   LABORATORY, RADIOLOGICAL AND DIAGNOSTIC DATA: Currently, white cell count of 12.1, up from 1.4, hemoglobin and hematocrit 13.0 and 38.7, pulse 254. Renal panel is unremarkable. Lipase is 874. LFTs were normal on admission. Imaging: I have personally reviewed all imaging. He has one large gallstone on his ultrasound. No pericholecystic fluid. No sonographic Murphy'Mitchell. CT chest: There is mild stranding around the pancreas, concern for acute pancreatitis. Thickening of right colon and paracolic soft tissues, likely focal. The gallbladder is distended with large calcified gallstones.   ASSESSMENT AND PLAN: Mr. Craig Mitchell is a pleasant 64 year old male with history of colon cancer and Crohn'Mitchell who presents with what sounds like gallstone pancreatitis. He does have thickened right colon and I am concerned for colitis which could make surgery difficult and the pain associated with this may confuse whether his pancreatitis has resolved clinically. I will continue to re-evaluate if his pain improves, but keep n.p.o. until he has resolved. Will  potentially take out gallbladder at this admission depending if pain resolves. May need to treat colitis first.  ____________________________ Si Raiderhristopher A. Belmira Daley, MD cal:ap D: 05/18/2012 10:32:48 ET           T: 05/18/2012 12:01:11 ET JOB#: [id number] cc: Cristal Deerhristopher A. Tomeika Weinmann, MD, <Dictator> Jarvis NewcomerHRISTOPHER A Rosemarie Galvis MD ELECTRONICALLY SIGNED 05/20/2012 12:53

## 2014-12-27 NOTE — Consult Note (Signed)
CC: pancreatitis, gall bladder disease, colon abnormality on CT.  I agree with plans for gall bladder removal.  He feels much better than on admission.  Abd with minimal RUQ tenderness, no distention, no palpable masses.  Electronic Signatures: Scot JunElliott, Robert T (MD)  (Signed on 11-Sep-13 17:06)  Authored  Last Updated: 11-Sep-13 17:06 by Scot JunElliott, Robert T (MD)

## 2014-12-27 NOTE — Op Note (Signed)
PATIENT NAME:  Craig Mitchell, Craig Mitchell MR#:  [id number] DATE OF BIRTH:  May 02, 1951  DATE OF PROCEDURE:  05/21/2012  PREOPERATIVE DIAGNOSIS: Gallstone pancreatitis.   POSTOPERATIVE DIAGNOSIS: Gallstone pancreatitis.   PROCEDURE PERFORMED: Laparoscopic cholecystectomy with cholangiogram and laparoscopic lysis of adhesions.   SURGEON: Ida Roguehristopher Dillon Mcreynolds, MD  ESTIMATED BLOOD LOSS: 30 mL.   ANESTHESIA: General.   COMPLICATIONS: None.  SPECIMENS: Gallbladder.   INDICATION FOR SURGERY: Craig Mitchell is a pleasant 64 year old male who was recently admitted with a gallstone pancreatitis. His lipase had resolved and his pain had resolved and therefore it was deemed necessary to take out his gallbladder to prevent recurrence.   DETAILS OF PROCEDURE: Informed consent was obtained. He was brought to the operating room suite and laid on the operating room table. He was induced, endotracheal tube was placed, and general anesthesia was administered. His abdomen was then prepped and draped in standard surgical fashion. A longitudinal midline incision was made above his previous hernias, which I elected not to fix at this time. This was deepened down to the fascia. The fascia was grasped. The fascia was incised. A gloved finger was placed through the fascial defect and there were adhesions adjacent to where my fascial incision was. I did a finger sweep to be able to allow a Hassan trocar in there. I placed two 0 Vicryl stay sutures in the fascia and insufflated the abdomen. I put the 10 mm, 30 degree scope in the abdomen. I saw that there were multiple adhesions to the gallbladder and to the anterior abdominal wall to the right side probably from his previous colectomy. I then placed an 11 port inferior to his epigastrium. I then took down adhesions off the gallbladder and cleared significant adhesions to the abdominal wall so that I could put a lateral port, which I did next. I then placed a 5 mm midclavicular port  approximately 2 cm below the costal margin. Then I proceeded to take all the adhesions off the gallbladder. The gallbladder was then reflected over the dome of the liver. The cystic duct and cystic artery were dissected out. A critical view was obtained. I then put a clip across the proximal cystic duct and I incised the duct. There was release of bile. I then put a cholangiogram catheter through an Angiocath and did a cholangiogram. There was nice filling of both the distal and proximal ducts.  Of note, there was delayed filling into the duodenum, which nevertheless filled without any obvious filling defects. I then clipped the cystic duct twice on the down side as well as placed three clips on the cystic artery and ligated both structures. There was a more posterior cystic artery as well that I clipped and ligated.  I then proceeded to take the gallbladder of the gallbladder fossa using hook electrocautery. The gallbladder was then taken out through an Endo Catch bag through the umbilical port. I then proceeded to obtain hemostasis using Bovie electrocautery. After I was satisfied with my hemostasis, I removed all of the ports under direct visualization. The supraumbilical port was closed with the previously placed stay sutures. The skin was then closed with 4-0 Monocryl interrupted dermal sutures. Dermabond was then placed over the wound. He was then awakened and extubated and taken to the postanesthesia care unit. There were no immediate complications.  ____________________________ Si Raiderhristopher A. Jaylee Lantry, MD cal:slb D: 05/21/2012 13:33:59 ET T: 05/21/2012 13:58:06 ET JOB#: [id number]  cc: Cristal Deerhristopher A. Korey Prashad, MD, <Dictator> Jarvis NewcomerHRISTOPHER A Bennetta Rudden MD ELECTRONICALLY SIGNED 05/22/2012  16:21 

## 2014-12-27 NOTE — H&P (Signed)
PATIENT NAME:  Craig Mitchell, [name]:  [id number] DATE OF BIRTH:  1951-01-12  DATE OF ADMISSION:  05/17/2012  ADMITTING PHYSICIAN: Enid Baasadhika Lamarius Dirr, MD   [facility]   CHIEF COMPLAINT: Abdominal pain, nausea, vomiting.   HISTORY OF PRESENT ILLNESS: Mr. Craig SideSecord is a 64 year old male with past medical history significant for history of colon cancer, status post resection and chemotherapy and in remission for five years now, history of Crohn's disease, comes to the hospital from home with nausea, vomiting, and also severe abdominal pain started yesterday. The patient says he has been fine up until yesterday morning, and when he woke up he did not feel right. By afternoon, he was nauseous and had severe epigastric and periumbilical abdominal pain and started vomiting. He has been vomiting greenish-yellowish stuff and then dry heaves. He has had poor p.o. intake and is very dehydrated. His pain did not get better. He was concerned because he was concerned about recurrence of colon cancer because it started just like this, and he had bowel obstruction at the time. He comes to the hospital. CT of the abdomen shows peripancreatic inflammation with lipase elevated at greater than 3000, and he is being admitted for treatment of pancreatitis.   PAST MEDICAL HISTORY:  1. History of colon cancer, status post partial colon resection and chemotherapy. Currently in remission for five years, being treated at [facility].  2. Fluctuating blood pressure.  3. Crohn's disease.  4. Chemotherapy-related peripheral neuropathy.   PAST SURGICAL HISTORY:  1. Knee surgery.  2. Partial colon resection.   ALLERGIES: No known drug allergies.   MEDICATIONS AT HOME:  1. Celexa 40 mg p.o. daily.  2. Prilosec 20 mg p.o. daily.  3. Tylenol 500 to 1000 mg up to t.i.d. as needed for pain.   SOCIAL HISTORY: He lives at home with his wife and has a daughter. He smokes about 1/2 pack per day and drinks beer  occasionally.   FAMILY HISTORY: Mom with lung cancer and dad died young with heart disease.   REVIEW OF SYSTEMS: CONSTITUTIONAL: No fever, fatigue, or weakness. EYES: No blurred vision, double vision, inflammation, or glaucoma. ENT: No tinnitus, ear pain, hearing loss, epistaxis or discharge. RESPIRATORY: No cough, wheeze, hemoptysis, or chronic obstructive pulmonary disease. CARDIOVASCULAR: No chest pain, orthopnea, edema, arrhythmia, palpitations, or syncope. GI: Positive for nausea and vomiting. Positive for diarrhea secondary to Crohn's disease. Also has abdominal pain which is acute. No hematemesis. Positive for occasional rectal bleeding due to his Crohn's disease. GENITOURINARY: No dysuria, hematuria, renal calculus, frequency usually but felt that he could not urinate just this morning.  ENDOCRINE: No polyuria, nocturia, thyroid problems, heat or cold intolerance. HEMATOLOGY: No anemia, easy bruising or bleeding. SKIN: No acne, rash, or lesions. MUSCULOSKELETAL: No neck pain, shoulder pain, arthritis, or gout but has lower back pain and receives epidural injections. NEUROLOGIC: No numbness, weakness, cerebrovascular accident, transient ischemic attack, or seizures. He has poor balance due to neuropathy from his chemotherapy. PSYCHIATRIC/PSYCHOLOGICAL: No anxiety, insomnia, or depression.   PHYSICAL EXAMINATION:  VITAL SIGNS: Temperature, afebrile, pulse 68, respirations 20, blood pressure 202/92, pulse oximetry 97% on room air.   GENERAL: Well built, well nourished male lying in bed, not in any acute distress.   HEENT: Normocephalic, atraumatic. Pupils are equal, round, reacting to light. Anicteric sclerae. There is subconjunctival hemorrhage in the median Mitchell of the left eye from constant vomiting. Oropharynx clear without erythema, mass or exudates.   NECK: Supple. No thyromegaly, jugular  venous distention, carotid bruits. No lymphadenopathy.   LUNGS: Moving air bilaterally. No wheeze or  crackles. No use of accessory muscles for breathing.   CARDIOVASCULAR: S1, S2 regular rate and rhythm. No murmurs, rubs, or gallops.  ABDOMEN: Soft, there is tenderness in the periumbilical region and also epigastric region. No guarding or rigidity. Normal bowel sounds. No hepatosplenomegaly.   EXTREMITIES: No pedal edema. No clubbing or cyanosis. 2+ dorsalis pedis pulses palpable bilaterally.   SKIN: No acne, rash, or lesions.   LYMPHATICS: No cervical or inguinal lymphadenopathy.   NEUROLOGIC: Cranial nerves are intact. No focal motor or sensory deficit.   PSYCHOLOGICAL: The patient is awake, alert, oriented x3.   LABORATORY, DIAGNOSTIC AND RADIOLOGICAL DATA: WBC is 11.4, hemoglobin 15.4, hematocrit 44.0, platelet count 342.0. Sodium 138, potassium 3.6, chloride 102, bicarbonate 21, BUN 9, creatinine 1.05, glucose 152, calcium 8.6. ALT 15, AST 13, alkaline phosphatase 87, total bilirubin 1.1, albumin 3.6, lipase elevated to greater than 3000. Ultrasound of the abdomen is showing fluid adjacent to the liver and gallbladder and gallstones are present. No evidence of acute cholecystitis is present. The pancreatic head appears normal.  CT of the abdomen and pelvis showed increased density in peripancreatic fat extending along the duodenum suspicious for acute pancreatitis. Pancreatic mass is not demonstrated. There is abnormal appearance in the right colon and paracolic soft tissues on the right Mitchell, cannot exclude focal colitis or diverticulosis of the colon. Possibility of recurrent malignancy is raised, however, no bulky intra-abdominal lymphadenopathy is seen. The gallbladder is distended and exhibits a large calcified stone. No definite gallbladder wall thickening is present.   ASSESSMENT AND PLAN: The patient is a 64 year old male with history of colon cancer, status post resection and chemotherapy and currently in remission, and also has history of Crohn's disease, admitted for acute  pancreatitis.   1. Acute pancreatitis: Likely gallstone-related pancreatitis as seen on the ultrasound and the CT abdomen. So, we will admit and keep him n.p.o., IV fluids, GI and Surgical consult for the same, and follow-up lipase in the a.m. Advance diet as lipase improves and symptomatically gets better.  2. Malignant hypertension: History of fluctuating blood pressure but not on any medications at home, currently elevated from pain. So, we will do IV hydralazine p.r.n.  3. History of colon cancer: Being treated at [facility] and currently in remission for five years now. Because of the mild changes in the CT, we will get the  prior records from [facility] to compare to make sure there is no recurrence of malignancy. Also, we will get a CEA level to compare it with  the old levels. GI has been consulted.  4. Depression:  Hold Celexa for now as the patient is n.p.o.  5. Chronic low back pain: Tylenol p.r.n. and Dilaudid p.r.n. for pain for pancreatitis.  6. Tobacco use disorder: Nicotrol inhaler and counseled for three minutes.  7. Gastrointestinal and deep vein thrombosis prophylaxis: IV Protonix and TED stockings.   CODE STATUS:  FULL CODE.     TIME SPENT ON ADMISSION: 50 minutes.   ____________________________ Enid Baas, MD rk:cbb D: 05/17/2012 14:48:04 ET T: 05/17/2012 15:09:30 ET JOB#: [id number]  cc: Enid Baas, MD, <Dictator> [facility] Enid Baas MD ELECTRONICALLY SIGNED 05/17/2012 18:45

## 2014-12-27 NOTE — Consult Note (Signed)
CC: pancreatitis.  Pt feels a little better, PCA helping.  Still tender.  His CT suggests colon inflammation, esp in right gutter.  He has had Crohn;s and off med for 1 year so it could be Crohn;s, could also be inflammatory pancreatic fluid draining down the right paracolic gutter.  Doubt cancer as his last colonoscopy was a year ago ( I think).  Continue current course.  Consider repeat CT before gall bladder surgery.  Also consider colonoscopy before the surgery.   Electronic Signatures: Scot JunElliott, Gavin Telford T (MD)  (Signed on 09-Sep-13 18:47)  Authored  Last Updated: 09-Sep-13 18:47 by Scot JunElliott, Schyler Counsell T (MD)

## 2014-12-27 NOTE — Discharge Summary (Signed)
PATIENT NAME:  Craig Mitchell, Craig Mitchell MR#:  [id number] DATE OF BIRTH:  [birth year]  DATE OF ADMISSION:  05/17/2012 DATE OF DISCHARGE:  05/22/2012  DIAGNOSES:  1. Acute pancreatitis likely due to gallstones and also alcohol use status post laparoscopic cholecystectomy on 05/21/2012. 2. 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  3. Leukocytosis.  4. Hypomagnesemia.  5. Alcohol use. 6. Smoking. 7. History of colon cancer, status post partial colon resection and chemotherapy. 8. History of fluctuating blood pressure. 9. Crohn's disease. 10. Chemotherapy related peripheral neuropathy.   DISPOSITION: Patient is being discharged home.   FOLLOW UP: Follow up with primary care physician, Dr. Mechele Collin, and Dr. Juliann Pulse in 1 to 2 weeks after discharge.    DIET: Regular.   ACTIVITY: As tolerated.   DISCHARGE MEDICATIONS:  1. Omeprazole 20 mg daily.  2. Citalopram 40 mg daily.  3. Tylenol extra strength 500 mg 2 tablets t.i.d.  4. Neurontin 600 mg t.i.d.  5. Vitamin B12, 12 mg subcutaneously once a month.  6. Tylenol/hydrocodone 325/5, 1 tablet every six hours p.r.n.   CONSULTATIONS:  1. GI consultation with Dr. Mechele Collin.  2. Surgical consultation with Dr. Juliann Pulse.  LABORATORY, DIAGNOSTIC AND RADIOLOGICAL DATA: CT of the abdomen and pelvis showed increased density in the peripancreatic fat extending along the duodenum suspicious for acute pancreatitis, abnormal appearance of the right colon in the pericolic soft tissue on the right. Cannot exclude focal colitis or diverticulitis. No bulky lymphadenopathy. Gallbladder is distended containing large calcified gallstones. No definite gallbladder wall thickening. Abdominal ultrasound showed evidence of gallstones without any cholecystitis. Intraoperative cholangiogram: Prominent narrowing of the distal CBD with biliary distention. CEA mildly elevated 6.3. White count 11.4 to 12.1. Normal platelet count. Normal hemoglobin. Glucose 152.  Lipase more than 3000 on admission, normal by the time of his discharge. Normal LFTs. Normal electrolytes. Normal creatinine and BUN and creatinine.   HOSPITAL COURSE: Patient is a 64 year old male with past medical history of colon cancer, status post colon resection, chemotherapy, Crohn's disease who presented with abdominal pain. He was found to have pancreatitis which was confirmed on CAT scan. His lipase level was more than 3000 on admission. Abdominal ultrasound showed gallstones. He also had a history of alcohol abuse. He was admitted to the hospital, initially kept n.p.o., started on IV fluids, p.r.n. antiemetics and analgesics. Once his lipase level normalized he was started on a diet. His hepatic panel was normal. Blood cultures were negative. Surgical consultation with Dr. Juliann Pulse was obtained and patient underwent lap cholecystectomy. GI consultation with Dr. Mechele Collin was also obtained who recommended conservative management. Intraoperative cholangiogram showed slight  narrowing of the common bile duct which Dr. Mechele Collin felt was due to residual effect of pancreatitis. Since patient had a history of Crohn's disease he recommended doing an M.R.C.P. several weeks after his current surgery to rule out any sclerosing cholangitis. Patient's blood pressure was elevated on presentation. This was felt to be due to pain. He did have a history of fluctuating blood pressure at home. He did not need to be started on any medications. He had mild leukocytosis, likely due to his acute pancreatitis. He had a history of alcohol abuse and was started on CIWA protocol. He was extensively counseled about cessation. He was not in any active withdrawal by the time he left the hospital. He had mild hypomagnesemia which was supplemented. He is being discharged home in stable condition.   TIME SPENT: 45 minutes.  ____________________________ Darrick Meigs, MD sp:cms D: 05/22/2012  15:30:27 ET T: 05/23/2012 10:23:17  ET  JOB#: [id number] cc: Darrick MeigsSangeeta Adelise Buswell, MD, <Dictator> Darrick MeigsSANGEETA Carita Sollars MD ELECTRONICALLY SIGNED 05/30/2012 7:23

## 2014-12-30 NOTE — Discharge Summary (Signed)
PATIENT NAME:  Craig Mitchell, Craig Mitchell MR#:  [id number] DATE OF BIRTH:  20-Nov-1950  DATE OF ADMISSION:  11/17/2012 DATE OF DISCHARGE:  11/28/2012  PRIMARY CARE PHYSICIAN:  None local.   CONSULTING PHYSICIANS:  Dr. Leavy CellaBlocker, Dr. Bluford Kaufmannh, Dr. Belia HemanKasa, Dr. Wyn Quakerew.  DISCHARGE DIAGNOSES: 1.  Acute on chronic alcoholic pancreatitis.  2.  Pancreatic pseudocyst.  3.  Leukocytosis.  4.  Hypoglycemia.  5.  Hypokalemia.  6.  Hypomagnesemia.  7.  Diarrhea. 8.  Hypotension.  9.  Acute respiratory failure.  10.  Hypoalbuminemia.  11.  Alcoholic hepatitis and alcoholism.  12.  Hypertension.  13.  Neuropathy.  14.  Acute diastolic congestive heart failure.  15.  Elevated CA-19-9. 16.  Poor nutrition.  17.  A history of colon cancer status post a partial colon resection and chemotherapy.  18.  A history of Crohn'Mitchell disease.  19.  Tobacco abuse.   CONDITION:  Stable.   CODE STATUS:  FULL CODE.   HOME MEDICATIONS:  [facility].   DIET:  Low sodium, low fat, low cholesterol diet.   ACTIVITY:  As tolerated.  FOLLOW-UP CARE:  Follow up to Dr. Leavy CellaBlocker within 1 week, follow up with Dr. Bluford Kaufmannh within 1 to 2 weeks.   For detailed discharge summary, please refer to the interim discharge summary dictated by Dr. Auburn BilberryShreyang Patel two days ago   1.  Acute on chronic pancreatitis, which has improved. The patient did tolerate diet. No abdominal pain, nausea, vomiting, or diarrhea. He is on taking Creon and Lomotil p.r.n.  2.  Acute respiratory failure possibly due to acute diastolic CHF. The patient was extubated. After extubation the patient has no shortness of breath, cough, or wheezing. The patient has been treated with low-dose Lasix.  3.  Leukocytosis. The patient was on Zosyn for possible cholangitis. Based on his CT scan and ultrasound there is no evidence of cholangitis. The patient'Mitchell was C. dif also negative. The patient was treated with Flagyl but  according to Dr. Leavy CellaBlocker there is no evidence of infection. He suggested discontinue antibiotics. So far the patient'Mitchell WBC is decreased to 18 today.  4. For severe hypoalbuminemia, the patient received a transfusion of albumin, a total 50 g. Leg edema has significantly improved.  5.  For hypokalemia, hypomagnesemia, the patient has been treated with supplement, magnesium is 1.6 today. We will give IV magnesium and continue p.o. magnesium.  6.  Hypotension while on ventilation, possibly due to sedation, has improved after extubation.  7.  Hypoglycemia. The patient has a severe hypoglycemia, possibly due to pancreatitis with decreased p.o. intake, has improved after oral intake. The patient still has weakness. The patient is to receive physical therapy. According to PT evaluation, the patient needs subacute rehab. The patient is clinically stable and will be discharged to a subacute rehab today.   I discussed the patient'Mitchell discharge plan with the patient, case manager and nurse.   TIME SPENT:  About 43 minutes.  ____________________________ Shaune PollackQing Allen Basista, MD qc:jm D: 11/28/2012 09:28:09 ET T: 11/28/2012 10:00:34 ET JOB#: [id number]  cc: Shaune PollackQing Vander Kueker, MD, <Dictator> Shaune PollackQING Kamayah Pillay MD ELECTRONICALLY SIGNED 11/28/2012 18:12

## 2014-12-30 NOTE — Consult Note (Signed)
Impression:    64yo male w/ h/o alcohol abuse, pancreatitis, cirrhosis, Crohn's disease, colon CA, s/p partial colectomy and chemo admitted with spontaneous bacterial peritonitis.     His BP is better and he is off pressors.  Still fairly tachycardic.  He remains confused.    Paracentesis shows signficant inflammation.  His peritoneal cultures are negative, but they were obtained after he was on antibiotics for a day.  Would plan on treating for SBP.    CT showed no evidence for perforation or infected pseudocyst.    His CXR and CT show what is likely atelectasis rather than infiltrate.  He is breathing comfortably on RA with good oxygenation.  I do not think that he has pneumonia.    Will conitnue zosyn for now.  Will eventually be able to change to po.    Will d/c vanco as no resistant GPC have been identified. 7)     He has cirrhosis based on CT liver findings and ascites.  His ammonia was slightly elevated on admission.  Will recheck as this could explain his confusion.  Electronic Signatures: Yamilee Harmes MPH, Rosalyn GessMichael E (MD) (Signed on 18-Apr-14 09:55)  Authored   Last Updated: 18-Apr-14 10:07 by Lyliana Dicenso MPH, Rosalyn GessMichael E (MD)

## 2014-12-30 NOTE — Consult Note (Signed)
Chief Complaint:  Subjective/Chief Complaint Events from yesterday noted. His pancreatitis was improving both clinically as well as by labs before he had significant change in his mental status associated with hypotension and respiratory failure. Patient is currently intubated and ventilated.   VITAL SIGNS/ANCILLARY NOTES: **Vital Signs.:   14-Mar-14 14:00  Vital Signs Type Routine  Pulse Pulse 94  Respirations Respirations 21  Systolic BP Systolic BP 970  Diastolic BP (mmHg) Diastolic BP (mmHg) 59  Mean BP 74  Pulse Ox % Pulse Ox % 100  Oxygen Delivery Ventilator Assisted  Pulse Ox Heart Rate 90   Brief Assessment:  Additional Physical Exam Bowel sounds sluggish. Abdomen is otherwise soft and fairly benign.   Lab Results: Routine Chem:  14-Mar-14 04:15   Result Comment labs - This specimen was collected through an   - indwelling catheter or arterial line.  - A minimum of 79ms of blood was wasted prior    - to collecting the sample.  Interpret  - results with caution.  Result(s) reported on 20 Nov 2012 at 11:31AM.  Result Comment LABS - This specimen was collected through an   - indwelling catheter or arterial line.  - A minimum of 543m of blood was wasted prior    - to collecting the sample.  Interpret  - results with caution.  Result(s) reported on 20 Nov 2012 at 04:49AM.  Result Comment CALCIUM - RESULTS VERIFIED BY REPEAT TESTING.  - NOTIFIED OF CRITICAL VALUE  - C/TESS THOMAS AT 0447 11/20/12.PMH  - READ-BACK PROCESS PERFORMED.  Result(s) reported on 20 Nov 2012 at 04:49AM.  Phosphorus, Serum 2.6  Magnesium, Serum 2.1 (1.8-2.4 THERAPEUTIC RANGE: 4-7 mg/dL TOXIC: > 10 mg/dL  -----------------------)  Glucose, Serum  115  BUN 13  Creatinine (comp) 1.29  Sodium, Serum  133  Potassium, Serum 3.8  Chloride, Serum 106  CO2, Serum 21  Calcium (Total), Serum  6.3  Anion Gap  6  Osmolality (calc) 267  eGFR (African American) >60  eGFR (Non-African American)  60  (eGFR values <6074min/1.73 m2 may be an indication of chronic kidney disease (CKD). Calculated eGFR is useful in patients with stable renal function. The eGFR calculation will not be reliable in acutely ill patients when serum creatinine is changing rapidly. It is not useful in  patients on dialysis. The eGFR calculation may not be applicable to patients at the low and high extremes of body sizes, pregnant women, and vegetarians.)  Cardiac:  14-Mar-14 04:15   CK, Total 57  CPK-MB, Serum 2.5 (Result(s) reported on 20 Nov 2012 at 04:43AM.)  Troponin I < 0.02 (0.00-0.05 0.05 ng/mL or less: NEGATIVE  Repeat testing in 3-6 hrs  if clinically indicated. >0.05 ng/mL: POTENTIAL  MYOCARDIAL INJURY. Repeat  testing in 3-6 hrs if  clinically indicated. NOTE: An increase or decrease  of 30% or more on serial  testing suggests a  clinically important change)  Routine Hem:  14-Mar-14 04:15   WBC (CBC)  18.7  RBC (CBC)  2.27  Hemoglobin (CBC)  9.1  Hematocrit (CBC)  27.7  Platelet Count (CBC) 235  MCV  123  MCH  40.1  MCHC 32.7  RDW  20.5  Bands 5  Segmented Neutrophils 86  Lymphocytes 5  Monocytes 2  Metamyelocyte 2  NRBC 1  Diff Comment 1 ANISOCYTOSIS  Diff Comment 2 POLYCHROMASIA  Diff Comment 3 PLTS VARIED IN SIZE  Diff Comment 4 HYPERSEGMENTED NEUTR  Result(s) reported on 20 Nov 2012 at 04:49AM.  Radiology Results: XRay:    13-Mar-14 15:23, Chest Portable Single View  Chest Portable Single View   REASON FOR EXAM:    sob/hypoxia  COMMENTS:       PROCEDURE: DXR - DXR PORTABLE CHEST SINGLE VIEW  - Nov 19 2012  3:23PM     RESULT: Comparison is made to the study of November 17, 2012.    There has been marked deterioration in the appearance of chest with   pulmonary interstitial edema and early alveolar edema. The cardiac   silhouette is not enlarged. There is no pleural effusion.    IMPRESSION:  The findings are consistent with pulmonary interstitial and   early  alveolar edema. There is no pulmonary vascular cephalization. There   has been interval placement of a PICC line on the right.   Dictation Site: 2        Verified By: DAVID A. JORDAN, M.D., MD   Assessment/Plan:  Assessment/Plan:  Assessment Acute pancreatitis, Clinically andbiochemically resolving and current deterioration in clinical condition is not likely related to it although this remains a possibility. Abnormal CT showing gastric wall thickning. Probable ileus. Probable chronic pancreatitis with multiple psudocysts. Elevated CA19-9. Doubt pancreatic cancer. Respiratory failure and hypotension. Leucocytosis improving. Abnormal LFT's most likely secondary to ETOH. No evidence of cholangitis. LFT's are improving.   Plan Continue supportive care. Continue NG suction. Continue IV antibiotics. Dr. Elliott will follow over the weekend.   Electronic Signatures: Iftikhar, Shaukat (MD)  (Signed 14-Mar-14 15:28)  Authored: Chief Complaint, VITAL SIGNS/ANCILLARY NOTES, Brief Assessment, Lab Results, Radiology Results, Assessment/Plan   Last Updated: 14-Mar-14 15:28 by Iftikhar, Shaukat (MD) 

## 2014-12-30 NOTE — Consult Note (Signed)
Pt with pancreatitis, alcoholism, severe malnutrition, now on vent after resp collapse and altered mental status.  Abd soft, no involuntary guarding, bowel sounds present, no masses.  No GI recommendations.  Consider CT of head.  Electronic Signatures: Scot JunElliott, Robert T (MD)  (Signed on 15-Mar-14 14:58)  Authored  Last Updated: 15-Mar-14 14:58 by Scot JunElliott, Robert T (MD)

## 2014-12-30 NOTE — Consult Note (Signed)
PATIENT NAME:  Craig Mitchell, Craig Mitchell MR#:  [id number] DATE OF BIRTH:  [birth year]  DATE OF CONSULTATION:  12/23/2012  REFERRING PHYSICIAN:  Clovis Pu. Lenore Manner, MD    CONSULTING PHYSICIAN:  Corky Sox. Zettie Pho, PA-C  ATTENDING GASTROENTEROLOGIST: Lupita Dawn. Oh, MD.   REASON FOR CONSULTATION: Abdominal pain and ascites.   HISTORY OF PRESENT ILLNESS: This is a 64 year old gentleman with a past medical history significant for alcohol use and abuse and recurrent pancreatitis, who also has Crohn's disease and a history of colon cancer status post partial colectomy. He is status post cholecystectomy as well done in September 2013 for gallstone pancreatitis; however, approximately 1 month ago, he was readmitted for alcohol-induced pancreatitis with a lipase level of 1400. At that time, his white blood cells were 26,000. He is discharged in stable condition, and over the past week, he has noticed increased abdominal girth, as well as some worsening abdominal pain. His wife states that his abdominal pain got most severe yesterday, and he had an accompanying fever of 101 taken at home. They presented back to the Emergency Room, where he was found to have a markedly elevated white blood cell count of greater than 50,000. Of note, his lipase was within normal limits, and his ammonia level is only very slightly high at 45. His LFTs have returned back to normal as well. He was mildly anemic, but this is at his baseline. A followup CT of the abdomen and pelvis was obtained on the patient showing an unremarkable pancreas outside of atrophy; however, it was notable for mild increase in abdominal ascites. There were also increased pleural effusions with surrounding third spacing edema with an accompanying right lower lobe consolidation. No evidence of an obstruction, but there was cirrhotic changes on the liver. He was then admitted into the CCU and started on broad-spectrum antibiotics, including vancomycin and Zosyn. He also underwent a  paracentesis for diagnostic purposes, and this was suggestive of SBP. Per the patient's wife, who is giving much of the history to me, he has been somewhat confused over the past 48 hours with increased fatigue. He has been difficult to arouse. He has also been complaining of abdominal pain and lower extremity edema. No nausea or vomiting. No fever since admission. His last alcoholic drink was 1 month ago.   PAST MEDICAL HISTORY: Alcohol use and abuse, hypertension, colon cancer status post partial colectomy and chemotherapy, hypertension, tobacco use, Crohn's disease.   PAST SURGICAL HISTORY: Partial colectomy and history of a knee surgery.   ALLERGIES: No known drug allergies.   HOME MEDICATIONS: Celexa, Prilosec, lisinopril, desipramine, Bentyl, gabapentin, ibuprofen and Percocet.   FAMILY HISTORY: His mother had lung cancer. The patient's son has a history of alcohol use and abuse. No known family history of GI malignancy or cirrhosis.   SOCIAL HISTORY: The patient does have a history significant for alcohol use and abuse and drinking several beers per day; however, it is unclear exactly how much. Per the history obtained by the patient's family, it has been about 1 month since he has drank. He also is a daily tobacco user, smoking about a half pack per day.   REVIEW OF SYSTEMS: A 10 system review of systems was obtained on the patient. He has been having lower extremity edema. Also a low-grade fever. There has been increasing fatigue, making him difficult to arouse per the family.   REVIEW OF SYSTEMS: Somewhat limited due to the patient's current clinical status; however, I was able to get  much information from the patient's family. All pertinent positives are mentioned above and are otherwise negative.   PHYSICAL EXAMINATION: VITAL SIGNS: Blood pressure 121/66, respirations 23, heart rate 118, temp 98.8, bedside pulse ox 95%.  GENERAL: This is a 64 year old gentleman resting quietly in the  CCU, alert but very difficult to arouse, unable to tell if he is oriented.  HEAD: Atraumatic, normocephalic.  NECK: Supple. No lymphadenopathy noted.  HEENT: Sclerae anicteric. Mucous membranes moist.  PULMONARY: Respirations are even and unlabored. Clear to auscultation in bilateral anterior lung fields.  CARDIAC: Regular rate and rhythm. S1, S2 noted.  ABDOMEN: Soft, nontender. Notable for a fluid wave suggestive of significant ascites. There are surgical scars from prior abdominal surgeries and a palpable midline hernia that is fully reducible. Normoactive bowel sounds are noted in all 4 quadrants. No guarding or rebound. No signs of an acute abdomen. No organomegaly or masses palpated; however, exam is limited secondary to increased abdominal girth.  RECTAL EXAM: Deferred.  EXTREMITIES: Lower extremities are wrapped in Ace bandages due to edema. Above the knee does feel quite swollen. Nontender. 2+ pulses noted in bilateral upper extremities.   IMAGING: CT of the abdomen was obtained on the patient showing increased pleural effusions with third spacing edema and mild increase in ascites. There was also a right lower lobe consolidation of the lung. An atrophic pancreas. No evidence of a bowel obstruction. There were bilateral inguinal hernias containing fat and cirrhotic changes of the liver.   LABORATORY DATA: White blood cells 58.6, hemoglobin 9.8, hematocrit 30.1, platelets 341, sodium 130, potassium 3.6, BUN 8, creatinine 0.90, glucose is 70, PTT 38.1, MCV 109, bilirubin 0.5, alk phos 117, ALT 116, AST 25, lipase 126, amylase 267, ammonia 45, albumin 1.3. Blood cultures are pending.   ASSESSMENT: 1.  Sepsis with history of hypotension, tachycardia and markedly elevated white count.  2.  Abdominal pain.  3.  Abdominal ascites with likely spontaneous bacterial peritonitis.  4.  Right lower lobe consolidation, suspicious for pneumonia.  5.  History of alcohol use and abuse with a recent bout  of acute pancreatitis. No evidence of pancreatitis, however, on repeat CT scan, and lipase is within normal limits.  6.  Altered mental status, likely secondary to sepsis, the patient is difficult to arouse.   PLAN: I have discussed this patient's case in detail with Dr. Verdie Shire, who is involved in the development of the patient's plan of care. At this time, we did review the diagnostic paracentesis body fluid analysis, and it is suggestive of spontaneous bacterial peritonitis. Therefore, we do agree with the patient being maintained on broad-spectrum antibiotics, and we will await the final blood culture to evaluate, as the patient does also have a coexisting pneumonia as well, which can certainly be contributing. We do recommend continuing to monitor his white blood cell count, as well as his vital signs quite closely. We would also recommend considering a more therapeutic paracentesis of larger volume. This was discussed in detail with the patient's family members, including his wife and his daughter, and they verbalize understanding. All questions were answered. We will continue to follow this patient throughout hospitalization and make further recommendations per clinical course.   Thank you so much for this consultation and for allowing [place] to participate in the patient's plan of care.   This was discussed and agreed upon under supervisory agreement between myself and Dr. Verdie Shire.   ____________________________ Corky Sox. Madelline Eshbach, PA-C kme:lg D: 12/23/2012 13:01:29 ET  T: 12/23/2012 13:21:20 ET JOB#: [id number]  cc: Corky Sox. Adahlia Stembridge, PA-C, <Dictator> [place] PA ELECTRONICALLY SIGNED 12/24/2012 15:31

## 2014-12-30 NOTE — H&P (Signed)
PATIENT NAME:  Craig JakschSECORD, Elizardo S MR#:  [id number] DATE OF BIRTH:  [birth year]  DATE OF ADMISSION:  11/17/2012  PRIMARY CARE PHYSICIAN: Dr. Lambert ModySharp.  GASTROENTEROLOGIST: Dr. Marva PandaSkulskie.  CHIEF COMPLAINT: Abdominal pain, nausea, generalized weakness and lower extremity swelling.   HISTORY OF PRESENT ILLNESS: The patient is a 64 year old male patient with history of hypertension, prior alcoholic pancreatitis, depression, Crohn's disease, presents to the Emergency Room complaining of abdominal pain radiating to the back, generalized weakness, nausea and lower extremity swelling. The patient had a cholecystectomy done in September 2013 for gallstone pancreatitis. He has had on-and-off pain since then. Has had nausea, 1 episode of vomiting early today morning. In the Emergency Room, the patient has been found to have elevated WBC of 26,000 with elevated bilirubin of 3 and lipase of 1400. He is being admitted for pancreatitis and possible cholangitis.   He has no aggravating or alleviating factors of his pain. No radiation to the back. Rates 10 on 10.   He claims he drinks about 12 beers a week, but the daughter at bedside mentions that he drinks every day significantly more and he has been an alcoholic all of his life.   PAST MEDICAL HISTORY:  1.  Alcohol abuse.  2.  Hypertension.  3.  Colon cancer, status post partial colon [facility] and [facility].  4.  Chemotherapy-induced peripheral neuropathy.  5.  Crohn's disease.  6.  Hypertension.  7.  Alcoholic/gallstone pancreatitis.  8.  Tobacco abuse.   PAST SURGICAL HISTORY: Knee surgery and partial colon resection.   ALLERGIES: No known drug allergies.   HOME MEDICATIONS:  1.  Celexa 40 mg oral daily. 2.  Prilosec 20 mg oral 2 times a day.  3.  Lisinopril 20 mg oral daily.  4.  Desipramine 50 mg oral daily.  5.  Bentyl 10 mg oral 2 times a day.  6.  Gabapentin 300 mg 2 capsules oral 3 times a day.  7.  Ibuprofen 400 mg oral every 6  hours as needed for pain.  8.  Percocet 7.5 oral 3 times a day.   REVIEW OF SYSTEMS:    CONSTITUTIONAL: Complains of fatigue, weakness. No fever. No weight loss, weight gain.  EYES: No blurred vision, pain, redness.  ENT: No tinnitus, ear pain, hearing loss.  RESPIRATORY: No cough, wheeze, hemoptysis, dyspnea.  CARDIOVASCULAR: No chest pain, orthopnea. Has lower extremity edema.  GASTROINTESTINAL: Has nausea, vomiting and abdominal pain and constipation.  GENITOURINARY: No dysuria, hematuria, frequency.  ENDOCRINE: No polyuria, nocturia or thyroid problems.  HEMATOLOGIC AND LYMPHATIC: No easy bruising, bleeding.  INTEGUMENTARY: No acne, rash, lesions.  MUSCULOSKELETAL: Has chronic back pain, shoulder pain.  NEUROLOGICAL: No focal weakness, dysarthria, epilepsy. Does have peripheral neuropathy and numbness in the lower extremities.  PSYCHIATRIC: Has depression.   FAMILY HISTORY: Mom with lung cancer, died young with heart disease. The patient's son has problems with alcohol and quit 5 months back.   SOCIAL HISTORY: He lives at home with his wife. His daughter stays close by. He smokes 1/2 pack a day. Drinks beer every day.   PHYSICAL EXAMINATION:  VITAL SIGNS: Temperature 98, pulse 104, respirations 18, blood pressure 121/59, saturating 97% on room air.  GENERAL: Moderately built Caucasian male patient lying in bed, in mild distress secondary to the pain.  PSYCHIATRIC: Alert, oriented x 3. Mood and affect appropriate. Judgment intact.  HEENT: Atraumatic, normocephalic. Oral mucosa dry and pink. External ears and nose normal. No pallor. Icterus positive. Pupils bilaterally  equal and reactive to light.  NECK: Supple. No thyromegaly. No palpable lymph nodes. Trachea midline. No carotid bruit, JVD.  CARDIOVASCULAR: S1, S2, tachycardic without any murmurs, lower extremity edema 2+, peripheral pulses 2+.  RESPIRATORY: Normal work of breathing, has bilateral basal crackles.  GASTROINTESTINAL:  Soft, tender in the epigastric and right and left upper quadrant area. No rigidity or guarding. Bowel sounds present. No hepatosplenomegaly palpable.  SKIN: Warm and dry. No petechiae, rash, ulcers.  MUSCULOSKELETAL: No joint swelling, redness, effusion of the large joints. Normal muscle tone.  NEUROLOGICAL: Motor strength 4+/5 in upper and lower extremities and symmetrical. Sensation intact all over. Cranial nerves II through XII intact. No asterixis found.  LYMPHATIC: No cervical lymphadenopathy.  GENITOURINARY: No CVA tenderness or bladder distention.   LABORATORY AND RADIOLOGIC DATA: Glucose 80, BUN 10, creatinine 0.92 with sodium 128, potassium 2.8, chloride of 92, magnesium 0.8, calcium 7.2, lipase of 1417, albumin 1.7, bilirubin 3, alkaline phosphatase 240, AST 61, ALT 44. Troponin less than 0.02. WBC 26.8, hemoglobin 12.3, platelets of 279 with MCV 116. Urinalysis shows no bacteria. Serum acetaminophen of 29.   Ultrasound of the abdomen shows dilated CBD of 7 to 8 mm, likely secondary from prior cholecystectomy. Dilated pancreatic duct. Hepatic steatosis and mild amount of ascites.   ASSESSMENT AND PLAN:  1.  Acute alcoholic pancreatitis. The patient will be "nothing by mouth" except medications, on intravenous fluids. Pain control and symptomatic management of the nausea and vomiting. I have counseled the patient to quit alcohol, which is contributing to his pancreatitis and hepatitis.  2.  Leukocytosis. There is a concern for cholangitis in this patient with dilated pancreatic duct and common bile duct. Discussed with Dr. Niel Hummer of gastroenterology, who suggested involvement of alcohol. Will start on Zosyn. Get blood cultures. The patient might need ERCP if he worsens, although alcohol is thought to be the likely cause at this time for his elevated liver enzymes. Leukocytosis could be secondary to acute stress and dehydration.  3.  Hyponatremia and hypokalemia secondary to dehydration and  decreased intake. Start intravenous fluids and potassium replacement aggressively. Also hypomagnesemia to be replaced.  4.  Lower extremity edema secondary to hypoalbuminemia. Will get nutrition consult.  5.  Tobacco abuse. The patient was counseled for greater than 3 minutes to quit smoking. The patient has requested a nicotine patch, which will be placed.  6.  Hypertension. Continue lisinopril.  7.  Dehydration secondary to decreased intake and vomiting. Start on intravenous fluids.  8.  Deep venous thrombosis prophylaxis with heparin.   CODE STATUS: Full code.   TIME SPENT: Time spent today on this case was 60 minutes.   ____________________________ Molinda Bailiff Sudini, MD srs:jm D: 11/17/2012 18:07:21 ET T: 11/17/2012 18:51:27 ET JOB#: [id number]  cc: Wardell Heath R. Elpidio Anis, MD, <Dictator> Dr. Josie Saunders, MD Orie Fisherman MD ELECTRONICALLY SIGNED 11/17/2012 20:36

## 2014-12-30 NOTE — Discharge Summary (Signed)
ADDENDUM  PATIENT NAME:  Craig Mitchell, Craig Mitchell MR#:  [id number] DATE OF BIRTH:  1950/12/12  DATE OF ADMISSION:  11/17/2012 DATE OF DISCHARGE:    PRIMARY CARE PHYSICIAN: None local.   The patient was planned to be discharged to a skilled nursing facility yesterday, but the patient'Mitchell daughter changed her mind and wanted the patient to be discharged to home with home health and PT. The patient developed hypoglycemia this morning. Blood sugar was 30. He was treated with D50. Blood sugar increased to 120. The patient has no complaints. The patient'Mitchell magnesium is 1.4 this morning. He is being treated with magnesium IV with p.o. magnesium oxide. The patient is clinically stable and will be discharged to home with home health and PT today. I discussed the patient'Mitchell discharge plan with the patient and the patient'Mitchell daughter. All questions were answered. I discussed with the case manager and nurse.   TIME SPENT: About 36 minutes.    ____________________________ Shaune PollackQing Haydyn Liddell, MD qc:aw D: 11/29/2012 09:54:58 ET T: 11/29/2012 10:24:29 ET JOB#: [id number]  cc: Shaune PollackQing Gizella Belleville, MD, <Dictator> Shaune PollackQING Zacarias Krauter MD ELECTRONICALLY SIGNED 11/29/2012 15:06

## 2014-12-30 NOTE — Consult Note (Signed)
Pt seen and examined. Please see Wilhelmenia BlaseKaryn Earle's notes. Prob SBP and pneumonia. On broad spectrum Abx. More alert than earlier than today. Montiner WBC. Recommend therapeutic paracentesis later. Would benefit from daily prophylactic Abx, such as cipro or norflox even after treatment for SBP. Will follow. Thanks.  Electronic Signatures: Lutricia Feilh, [name] (MD)  (Signed on 16-Apr-14 16:36)  Authored  Last Updated: 16-Apr-14 16:36 by Lutricia Feilh, [name] (MD)

## 2014-12-30 NOTE — Consult Note (Signed)
Brief Consult Note: Diagnosis: ascites.   Patient was seen by consultant.   Consult note dictated.   Recommend further assessment or treatment.   Discussed with Attending MD.   Comments: CT [personally rev'd. No mention in report of rt retroperitoneal/perinephric fluid filled cavity. Will hav e Dr Egbert GaribaldiBird review with radiologist. may need paracentesis to r/o SBP.  Electronic Signatures: Lattie Hawooper, Jolanta Cabeza E (MD)  (Signed 15-Apr-14 23:36)  Authored: Brief Consult Note   Last Updated: 15-Apr-14 23:36 by Lattie Hawooper, Markcus Lazenby E (MD)

## 2014-12-30 NOTE — Consult Note (Signed)
Pt condition well outlined in Hospitalist and Pulmonologist notes.  Albumin indicative of severe malnutrition.  Abd at this time soft and no guarding, no palpable masses, no HSM,  bowel sounds present and normal.  No new suggestions.  Electronic Signatures: Scot JunElliott, Merina Behrendt T (MD)  (Signed on 16-Mar-14 12:28)  Authored  Last Updated: 16-Mar-14 12:28 by Scot JunElliott, Saher Davee T (MD)

## 2014-12-30 NOTE — H&P (Signed)
PATIENT NAME:  Craig JakschSECORD, Craig Mitchell#:  [id number] DATE OF BIRTH:  15-Apr-1951  DATE OF ADMISSION:  12/22/2012  PRIMARY CARE PHYSICIAN:  Reather LittlerLeslie Sharp, nurse practitioner.   REFERRING PHYSICIAN:  Dr. Jene Everyobert Kinner.   CHIEF COMPLAINT:  Abdominal pain.   HISTORY OF PRESENT ILLNESS:  The patient is a 64 year old Caucasian male with history of alcoholic pancreatitis, alcoholic liver disease, Crohn's disease, peripheral neuropathy, and history of colon cancer.  The patient was admitted last time, last month with acute on chronic alcoholic pancreatitis and pancreatic pseudocyst, leukocytosis and diarrhea and hypotension.  The patient was brought to the hospital today for evaluation of abdominal pain located at the central abdominal area, close to the periumbilical area for the last 4 days, described as constant, nagging, dull pain with sometimes sharp pain.  He graded the pain about 7 to 8 on a scale of 10, however he does not seem to be in that severe pain by observation.  Wife reported the low-grade fever reaching 100.1.  He had no vomiting, but occasional loose bowel movements.  Occasionally he might have little streaks of blood in the stool, but this is chronic for him.  Also reported that he was unable to urinate since the morning.  Evaluation here in the Emergency Department reveals hyponatremia, hypokalemia and severe leukocytosis reaching 53,000.  He is also anemic, however his hemoglobin at baseline.  CAT scan of the abdomen revealed increased ascites and development of bilateral pleural effusion.  Some consolidation in the right lower lung.  Unclear if this is atelectasis or infiltrate.  The patient also was hypotensive with a blood pressure 70 systolic.  Also, upon arrival of EMS his blood sugar was low as well, was 40 and he received treatment.  The patient is now in the process to be evaluated and admitted to the hospital for further management.  Surgical consultation with Dr. Excell Seltzerooper was obtained at the  Emergency Department and he felt this is not a surgical issue.  There was a question whether he has bowel obstruction, but after the CAT scan findings the patient does not have a bowel obstruction.   REVIEW OF SYSTEMS:  CONSTITUTIONAL:  He had low-grade fever.  No chills.  He has mild fatigue.  EYES:  No blurring of vision.  No double vision.  EARS, NOSE, THROAT:  No hearing impairment.  No sore throat.  No dysphagia.  CARDIOVASCULAR:  No chest pain.  No shortness of breath.  No syncope.  He has peripheral edema.  RESPIRATORY:  No shortness of breath.  No cough.  No hemoptysis.  No chest pain.  GASTROINTESTINAL:  Abdominal pain as described above.  No vomiting.  He had a few loose bowel movements.  Occasional streaks of blood.  This is not acute, but chronic complaint.  GENITOURINARY:  No dysuria or frequency of urination.  In fact, he states that he did not urinate since the morning.  MUSCULOSKELETAL:  No joint pain or swelling other than his chronic pains.  No muscular pain or swelling.  INTEGUMENTARY:  No skin rash.  No ulcers.  NEUROLOGY:  No focal weakness.  No seizure activity.  No headache.  PSYCHIATRY:  No anxiety.  No depression.  ENDOCRINE:  No polyuria or polydipsia.  No heat or cold intolerance.   PAST MEDICAL HISTORY:  Chronic alcoholism.  He quit a month ago.  Chronic liver disease, alcoholic in origin, chronic alcoholic pancreatitis, recurrent episodes of hypoglycemia and hypomagnesemia and hypokalemia.  Crohn's disease, diarrhea, hypoalbuminemia,  past history of systemic hypertension and peripheral neuropathy.  History of elevated CA 19-9.  History of colon cancer status post partial colon resection and chemotherapy.   PAST SURGICAL HISTORY:  Partial colon resection and knee surgery.   SOCIAL HABITS:  Chronic alcoholism.  He usually drinks beer daily, however he did not drink since last admission a month ago.  He was discharged on March 22nd.  The patient is a chronic smoker.  He  continues to smoke.  He cut down to eight cigarettes per day.  No other drug abuse.   SOCIAL HISTORY:  The patient is unemployed, living on disability.  He is married, living with his wife.   FAMILY HISTORY:  His mother suffered from lung cancer and she died from heart disease.  The patient has a son who is also alcoholic.   ADMISSION MEDICATIONS:  Nystatin 100,000 unit cream use in the affected area as needed 3 times a day.  Oxycodone 10 mg 3 times a day, tizanidine 4 mg 3 times a day as needed for muscular pain, spironolactone 25 mg once a day, magnesium oxide 500 mg twice a day, Flonase nasal spray two sprays in each nostril once a day.  Furosemide 20 mg twice a day, gabapentin 300 mg, take 2, that is 600 mg 3 times a day.  Sucralfate or Carafate 1 gram 4 times a day, lisinopril 20 mg once a day, Celexa 40 mg a day, ibuprofen 400 mg q. 4 to 6 hours as needed for pain, desipramine 25 mg taking 50 mg once a day and omeprazole 20 mg twice a day, vitamin B12 1000 mcg intramuscularly once a month.   ALLERGIES:  No known drug allergies.   PHYSICAL EXAMINATION: VITAL SIGNS:  His blood pressure is 73/44, respiratory rate 16, pulse was 100, temperature 98.3, pulse oximetry 90%.  GENERAL APPEARANCE:  Elderly male lying in bed in no acute distress.  HEAD AND NECK:  Showed mild pallor.  No icterus.  No cyanosis.  Ear examination revealed normal hearing, no discharge, no ulcers.  Examination of the nose showed no ulcers, no discharge, no bleeding.  Oropharyngeal examination revealed no oral thrush, no ulcers, no exudate.  EYES:  Revealed normal eyelids.  Normal conjunctivae.  Pupils are constricted bilaterally.  Could not elicit reactivity to light.  NECK:  Supple.  Trachea at midline.  No thyromegaly.  No cervical lymphadenopathy.  No masses.  HEART:  Revealed normal S1, S2.  No S3, S4.  No murmur.  No gallop.  No carotid bruits.  RESPIRATORY:  Revealed normal breathing pattern without use of accessory  muscles.  No rales.  No wheezing.  ABDOMEN:  Soft.  No significant tenderness upon palpation.  No rebound.  No rigidity.  The abdomen is distended slightly.  There are several hernias in the inguinal and also 1 periumbilical and 1 ventral hernia.  These are small and reducible.  MUSCULOSKELETAL:  No joint swelling.  No clubbing.  SKIN:  Revealed no ulcers.  No subcutaneous nodules, but he has peripheral edema.  His lower extremities has Radio broadcast assistant.  The edema is above that level at the knee and thighs.  It is +2.  NEUROLOGIC:  Cranial nerves II through XII are intact.  No focal motor deficit.  PSYCHIATRIC:  The patient is alert and oriented x 3.  Mood and affect were normal.   LABORATORY FINDINGS:  Serum glucose was 40, so repeat after treatment was 136, BUN 9, creatinine 0.9, sodium 130, potassium 3.2,  calcium was 6.4.  His albumin 1.1, total protein 3.7, bilirubin 0.4.  Liver transaminases were normal.  Troponin 0.02.  CBC showed white count of 53,000, hemoglobin 8.8.  His baseline hemoglobin was 8.5 and 8.1 a month ago.  Hematocrit 26, platelet count 288.  Prothrombin time 19, INR 1.7, PTT 38.  Chest x-ray showed bilateral pleural effusion.  There is more haziness and consolidation on the right lower lung field, possibly from the effusion.  I cannot rule out infiltrates.  CAT scan of the abdomen showed mildly increased pleural effusions with increased third space edema and mildly increased ascites compared to previous study.  Some progressive consolidation in the right lower lobe, either atelectasis or infiltrate.  Cirrhotic changes appears present in the liver.  Evidence of previous cholecystectomy.  Bilateral inguinal hernias are present and containing fluid.   ASSESSMENT: 1.  Sepsis with elevated white blood cell count reaching 53,000 along with hypotension.  The source is either infected ascitic fluid or spontaneous bacterial peritonitis versus right lower lobe pneumonia.  Also, we want to check his  urine.  Foley catheter was just inserted.  We will obtain a sample to make sure there is no urinary tract infection.  2.  Hypotension.  This is combination from possible sepsis, in addition to decreased intravascular volume, however the patient has increased peripheral edema and third spacing as well.  3.  Increasing ascites.   4.  Bilateral pleural effusion.  5.  Liver cirrhosis.  6.  Leukocytosis reaching 53,000.  7.  Right lower lobe consolidation, either secondary to atelectasis versus infiltrate.  8.  Hypoglycemia.  9.  Hypoalbuminemia.  10.  Chronic pancreatitis.  11.  Crohn's disease. 12.  Peripheral neuropathy.  13.  History of colon cancer status post resection.   PLAN:  We will admit the patient to the intensive care unit until he is more stable hemodynamically.  I will give the patient 1 bolus of fluid normal saline 500 mL x 1 dose, although this may exacerbate his peripheral edema and the third spacing, however we need to temporarily improve his blood pressure.  Blood cultures x 2 were ordered and urine culture as well.  Broad-spectrum IV antibiotic using vancomycin and Zosyn were ordered.  Monitor blood sugar and correct as needed.  We will obtain ultrasound of the abdomen and perform abdominal paracentesis and send fluid for evaluation at radiology department and under ultrasound guidance.  Due to hypotension, I will hold Lasix, lisinopril and spironolactone.  Due to his urinary retention, I will hold the desipramine, although the retention could be secondary to the edema of the penis.  It was even difficult to insert the Foley catheter.  Corrected the hypokalemia. Monitor Na level.  Time spent in evaluating this patient took more than 1-1/2 hours due to its complexity. Critical care time spent was more than 30 minutes. I also spoke with the patient, his wife and his daughter and I answered their questions.     ____________________________ Carney Corners. Rudene Re, MD amd:ea D: 12/22/2012  23:56:15 ET T: 12/23/2012 00:48:34 ET JOB#: [id number]  cc: Carney Corners. Rudene Re, MD, <Dictator> Karolee Ohs Dala Dock MD ELECTRONICALLY SIGNED 12/23/2012 5:10

## 2014-12-30 NOTE — Op Note (Signed)
PATIENT NAME:  Craig JakschSECORD, [name] MR#:  [id number] DATE OF BIRTH:  1951/07/09  DATE OF PROCEDURE:  11/18/2012  PREOPERATIVE DIAGNOSES: 1.  Acute pancreatitis.  2.  Pancreatic pseudocyst.  3.  Anemia.  4.  Hypokalemia.  5.  Alcoholic hepatitis and alcoholism.  6.  Hypertension.  7.  Poor venous access.  POSTOPERATIVE DIAGNOSES: 1.  Acute pancreatitis.  2.  Pancreatic pseudocyst.  3.  Anemia.  4.  Hypokalemia.  5.  Alcoholic hepatitis and alcoholism.  6.  Hypertension.  7.  Poor venous access.  PROCEDURES:  1. Ultrasound guidance for vascular access to right brachial vein.  2. Fluoroscopic guidance for placement of catheter.  3. Insertion of peripherally inserted central venous catheter, triple lumen, right arm.  SURGEON: Festus BarrenJason Dew, MD  ANESTHESIA: Local.   ESTIMATED BLOOD LOSS: Minimal.   INDICATION FOR PROCEDURE: This is a 64 year old male with alcoholic pancreatitis and multiple other ongoing issues. He needs a good durable venous access and we are asked to place a PICC line.   DESCRIPTION OF PROCEDURE: The patient's right arm was sterilely prepped and draped, and a sterile surgical field was created. The right brachial vein was accessed under direct ultrasound guidance without difficulty with a micropuncture needle and permanent image was recorded. 0.018 wire was then placed into the superior vena cava. Peel-away sheath was placed over the wire. A single lumen peripherally inserted central venous catheter was then placed over the wire and the wire and peel-away sheath were removed. The catheter tip was placed into the superior vena cava and was secured at the skin at 27 cm with a sterile dressing. The catheter withdrew blood well and flushed easily with heparinized saline. The patient tolerated procedure well.  ____________________________ Annice NeedyJason S. Dew, MD jsd:aw D: 11/18/2012 13:53:32 ET T: 11/18/2012 13:57:17 ET JOB#: [id number]  cc: Annice NeedyJason S. Dew, MD, <Dictator> Annice NeedyJASON S DEW  MD ELECTRONICALLY SIGNED 11/23/2012 11:52

## 2014-12-30 NOTE — Consult Note (Signed)
PATIENT NAME:  Craig Mitchell, [name]:  [id number] DATE OF BIRTH:  28-Jul-1951  DATE OF CONSULTATION:  12/25/2012  REFERRING PHYSICIAN:  Dr. Posey Pronto CONSULTING PHYSICIAN:  Heinz Knuckles. [name], MD  REASON FOR CONSULTATION: Peritonitis and possible pneumonia.   HISTORY OF PRESENT ILLNESS: The patient is a 64 year old male with a past history significant for alcohol abuse, pancreatitis, chronic Crohn's disease, colon cancer status post partial colectomy and chemotherapy admitted on 04/15 with abdominal pain. The patient is fairly confused and was unable to provide significant history.  The H and P indicates that he had been doing fairly well since his recent admission last month, but developed abdominal pain in the periumbilical area for approximately 4 days.  He was also noted to have a low-grade fever up to 100.1. When asked the patient denied any fevers and stated that he did not have any abdominal pain. He was also fairly confused. His evaluation revealed that he had hypotension and significant leukocytosis. He also had a CT scan which showed ascites with bilateral pleural effusions.  He was admitted to the CCU and started on pressors. He was also given broad-spectrum antibiotics and vancomycin and Zosyn. A few days into his hospitalization a paracentesis was performed and this demonstrated significant white cells, but cultures of the peritoneal fluid and the blood have been negative. He has been weaned off the pressors, but he remains tachycardic. He has remained confused during his hospitalization.   ALLERGIES: No known drug allergies.  PAST MEDICAL HISTORY: 1.  Alcohol abuse. The patient has not been drinking since his last discharge.  2.  Hypertension.  3.  Colon cancer status post partial colectomy and chemotherapy.  4.  Crohn's disease.  5.  Alcoholic pancreatitis.  6.  Gallstone pancreatitis status post cholecystectomy.   SOCIAL HISTORY: The patient lives with his wife. He has been a heavy  drinker for a long period of time, but quit in the last month. He smokes 1/2 pack cigarettes per day.   FAMILY HISTORY: Positive for lung cancer in his mother, coronary artery disease in his mother and alcohol abuse in his son.   REVIEW OF SYSTEMS:  Unable to obtain from the patient due to his confusion.   PHYSICAL EXAMINATION: VITAL SIGNS: T-max 100.4, T-current 97.4, pulse 114, blood pressure 104/45 and 97% on room air.  GENERAL: A 64 year old white man in no acute distress.  HEENT: Normocephalic, atraumatic. Pupils equal and reactive to light. Extraocular motion intact. Sclerae, conjunctivae and lids without evidence for emboli or petechiae. Oropharynx shows no erythema or exudate. Gums are in fair condition.  NECK: Supple. Full range of motion. Midline trachea. No lymphadenopathy. No thyromegaly.  LUNGS: Clear to auscultation bilaterally with good air movement. No focal consolidation. He was able to speak in full sentences.  HEART:  Regular rate and rhythm without murmur, rub or gallop. ABDOMEN:  Mildly tender in the mid epigastric area. There was no significant rebound or guarding. There was no obvious fluid wave present.  He had no hernias.  EXTREMITIES:  Positive edema 3+ in both lower extremities. No evidence for tenosynovitis.  SKIN: No rashes. Several eschars over his shins. He did have an ulcer over the left heel that was wrapped in bandages and not directly observed. He also had some scaly skin on the plantar aspect of the right foot. There were no stigmata of endocarditis, specifically no Janeway lesions or Osler nodes.  NEUROLOGIC: The patient was awake and oriented x 1. He was a fairly  poor historian and could not relate issues that were occurring prior to his admission.  When asked specific questions his answers contradicted the answers given on admission, that were noted in the H and P. He was moving his upper extremities and could follow simple commands.  PSYCHIATRIC: Mood and  affect appeared pleasant.   LABORATORY AND DIAGNOSTICS: BUN OF 4, creatinine 0.57, sodium of 130, potassium 3.1, bicarbonate 28 and anion gap of 6.  A lipase from admission was 126. LFTs showed an AST of 28, ALT 14, alk phos 100, total bilirubin 1.1, total protein 4.2 and albumin of 1.8. A white count is 26.3 with a hemoglobin of 9.0, platelet count of 209 and ANC of 23.9. White count on admission was 53.2 with an ANC of 49.9 and went as high as 58.6 and 55.0, respectively.   Blood cultures from admission show no growth. A urinalysis from admission was unremarkable.  Peritoneal fluid from 04/16 shows 706 nucleated cells 92% of which are neutrophils, albumin of less than 0.6. Amylase was 267, glucose of 69, LDH of 88 and a protein of 1.2. Peritoneal fluid has no growth in 48 hours.   A chest x-ray from admission showed atelectasis versus infiltrate in the right lung base. Effusion was also possible. Two-way of abdomen showed earlier partial small bowel obstruction versus an ileus. Repeat chest x-ray from 04/15 showed no significant change in the chest. A CT scan of the abdomen and pelvis with contrast demonstrated mild increased pleural effusions with increasing third space edema and mild increased ascites. Compared to the previous study, there was no obvious bowel obstruction. There was some consolidation in the right lower lobe thought to be atelectasis versus infiltrate. Cirrhotic changes were present in the liver.   IMPRESSION: A 64 year old male with a history of alcohol abuse, pancreatitis, cirrhosis, Crohn's disease and colon cancer status post partial colectomy and chemotherapy admitted with spontaneous bacterial peritonitis.   RECOMMENDATIONS: 1.  His blood pressure is better and he is off pressors. He is still fairly tachycardic. He remains significantly confused.  2.  Paracentesis shows significant inflammation. His peritoneal cultures are negative, but they were obtained after he was on  antibiotics for a day. Would plan on treating for spontaneous bacterial peritonitis.   3.  The CT shows no evidence for perforation or infected pseudocyst.  4.  His CT and chest x-ray show what is likely atelectasis rather than infiltrate. He is breathing comfortably on room air with good oxygenation. I do not think that he has pneumonia.  5.  We will continue Zosyn for now and eventually be able to change to p.o.  6.  We will discontinue vancomycin as no resistant gram-positive cocci have been identified.  7.  He has cirrhosis based on CT liver findings and ascites. His ammonia was slightly elevated on admission. We will recheck as this could explain his confusion.   This is a high-level infectious disease consult. Thank you very much for involving me in Mr. Parisi care. ____________________________ Heinz Knuckles. Rodriquez Thorner, MD meb:sb D: 12/25/2012 10:07:46 ET T: 12/25/2012 10:28:07 ET JOB#: [id number]  cc: Heinz Knuckles. Sary Bogie, MD, <Dictator> Brecklynn Jian E Miklos Bidinger MD ELECTRONICALLY SIGNED 12/26/2012 16:45

## 2014-12-30 NOTE — Consult Note (Signed)
Chief Complaint:  Subjective/Chief Complaint PATIENT WITH INCREASED CONFUSION TODAY. hARD TO FOLLOW COMMANDS. NO ABD PAIN.   VITAL SIGNS/ANCILLARY NOTES: **Vital Signs.:   20-Apr-14 07:47  Vital Signs Type Routine  Temperature Temperature (F) 98.9  Celsius 37.1  Pulse Pulse 101  Respirations Respirations 20  Systolic BP Systolic BP 119  Diastolic BP (mmHg) Diastolic BP (mmHg) 81  Mean BP 91  Pulse Ox % Pulse Ox % 90  Pulse Ox Activity Level  At rest  Oxygen Delivery Room Air/ 21 %   Brief Assessment:  Respiratory normal resp effort   Gastrointestinal Normal   Gastrointestinal details normal Soft  DISTENTED   Additional Physical Exam NAD CONFUSED. ASTERIXIS PRESENT   Lab Results: Routine Chem:  20-Apr-14 05:31   Glucose, Serum 82  BUN  2  Creatinine (comp)  0.50  Sodium, Serum  134  Potassium, Serum 3.5  Chloride, Serum 99  CO2, Serum 30  Calcium (Total), Serum  7.2  Anion Gap  5  Osmolality (calc) 264  eGFR (African American) >60  eGFR (Non-African American) >60 (eGFR values <52m/min/1.73 m2 may be an indication of chronic kidney disease (CKD). Calculated eGFR is useful in patients with stable renal function. The eGFR calculation will not be reliable in acutely ill patients when serum creatinine is changing rapidly. It is not useful in  patients on dialysis. The eGFR calculation may not be applicable to patients at the low and high extremes of body sizes, pregnant women, and vegetarians.)  Routine Hem:  20-Apr-14 05:31   WBC (CBC)  21.4  RBC (CBC)  2.31  Hemoglobin (CBC)  8.3  Hematocrit (CBC)  24.8  Platelet Count (CBC) 178  MCV  107  MCH  36.0  MCHC 33.6  RDW  17.7  Neutrophil % 83.8  Lymphocyte % 7.3  Monocyte % 7.4  Eosinophil % 0.9  Basophil % 0.6  Neutrophil #  17.9  Lymphocyte # 1.6  Monocyte #  1.6  Eosinophil # 0.2  Basophil # 0.1 (Result(s) reported on 27 Dec 2012 at 05:49AM.)   Assessment/Plan:  Assessment/Plan:  Assessment  CIRRHOSIS WITH INCREASED CONFUSION.   Plan WILL INCREASE LACTULOSE TO BID. DR. SGustavo LahTO RESUME CARE TOMORROW.   Electronic Signatures: WLucilla Lame(MD)  (Signed 20-Apr-14 10:11)  Authored: Chief Complaint, VITAL SIGNS/ANCILLARY NOTES, Brief Assessment, Lab Results, Assessment/Plan   Last Updated: 20-Apr-14 10:11 by WLucilla Lame(MD)

## 2014-12-30 NOTE — Consult Note (Signed)
Chief Complaint:  Subjective/Chief Complaint Pain is better. No new complaints.   VITAL SIGNS/ANCILLARY NOTES: **Vital Signs.:   12-Mar-14 17:19  Vital Signs Type Recheck; post 526m bolus  Pulse Pulse 98  Systolic BP Systolic BP 90  Diastolic BP (mmHg) Diastolic BP (mmHg) 60  Mean BP 70   Brief Assessment:  Additional Physical Exam Abdomen is fairly benign. No significant tenderness.   Lab Results: LabObservation:  12-Mar-14 14:19   OBSERVATION Reason for Test  Hepatic:  12-Mar-14 05:07   Bilirubin, Total  2.5  Alkaline Phosphatase  219  SGPT (ALT) 39  SGOT (AST)  50  Total Protein, Serum  5.1  Albumin, Serum  1.4  Routine Chem:  12-Mar-14 05:07   Glucose, Serum  62  BUN 11  Creatinine (comp) 1.27  Sodium, Serum  130  Potassium, Serum  3.1  Chloride, Serum 98  CO2, Serum 24  Calcium (Total), Serum  6.4  Osmolality (calc) 258  eGFR (African American) >60  eGFR (Non-African American) >60 (eGFR values <665mmin/1.73 m2 may be an indication of chronic kidney disease (CKD). Calculated eGFR is useful in patients with stable renal function. The eGFR calculation will not be reliable in acutely ill patients when serum creatinine is changing rapidly. It is not useful in  patients on dialysis. The eGFR calculation may not be applicable to patients at the low and high extremes of body sizes, pregnant women, and vegetarians.)  Result Comment calcium - RESULTS VERIFIED BY REPEAT TESTING.  - READ-BACK PROCESS PERFORMED.  - NOTIFIED OF CRITICAL VALUE  - c/margaret james 3/12/14at [id number] potassium/mg/ast - Slight hemolysis, interpret results with  - caution.  Result(s) reported on 12Mar 2014 at 06:18AM.  Anion Gap 8  Magnesium, Serum  0.9 (1.8-2.4 THERAPEUTIC RANGE: 4-7 mg/dL TOXIC: > 10 mg/dL  -----------------------)  Routine Hem:  12-Mar-14 05:07   WBC (CBC)  23.9  RBC (CBC)  2.67  Hemoglobin (CBC)  10.5  Hematocrit (CBC)  31.3  Platelet Count (CBC) 236  MCV   117  MCH  39.3  MCHC 33.6  RDW  20.1  Neutrophil % 91.3  Lymphocyte % 4.7  Monocyte % 2.6  Eosinophil % 1.2  Basophil % 0.2  Neutrophil #  21.8  Lymphocyte # 1.1  Monocyte # 0.6  Eosinophil # 0.3  Basophil # 0.1 (Result(s) reported on 18 Nov 2012 at 06:18AM.)   Assessment/Plan:  Assessment/Plan:  Assessment Acute on chronic pancreatitis. Multiple pancreatic cysts probably secondary to prior episodes of pancreatitis. Abnormal LFT's most likely secondary to ETOH hepatitis, improving. Doubt biliary obstryction or cholangitis. Leucocytosis is commonly seen in cases of acute ETOH hepatitis.   Plan May start clear liquids in am if continues to improve. Follow LFT's.  Continue antibiotics. Will follow.   Electronic Signatures: IfJill SideMD)  (Signed 12-Mar-14 19:09)  Authored: Chief Complaint, VITAL SIGNS/ANCILLARY NOTES, Brief Assessment, Lab Results, Assessment/Plan   Last Updated: 12-Mar-14 19:09 by IfJill SideMD)

## 2014-12-30 NOTE — Consult Note (Signed)
PATIENT NAME:  Craig Mitchell, Craig Mitchell MR#:  [id number] DATE OF BIRTH:  08-19-1951  DATE OF CONSULTATION:  11/18/2012  REFERRING PHYSICIAN:   CONSULTING PHYSICIAN:  Lurline Del, MD  REASON FOR CONSULTATION:  Abdominal pain, abnormal liver enzymes.   HISTORY OF PRESENT ILLNESS: The patient is a 64 year old male with history of hypertension, history of chronic pancreatitis, probable Crohn's disease, depression, chronic alcohol abuse. The patient came to the Emergency Room yesterday with diffuse abdominal pain. His liver enzymes were abnormal. Serum lipase was 1400 and I was called by Dr. Jacques Navy  for GI consultation for possible biliary obstruction or cholangitis. He had leukocytosis with a white cell count of 26,000. The patient was evaluated yesterday complaining of mild to moderate diffuse abdominal pain. Denied any nausea or vomiting. He is not a good historian. Apparently his last drink was 3 or 4 days ago according to him.   PAST MEDICAL HISTORY: Significant for alcohol abuse, alcoholic hepatitis, chronic pancreatitis, history of colon cancer, status post prior colon resection, questionable Crohn's disease.   HOME MEDICATIONS: Include Celexa, Prilosec, lisinopril, desipramine, Bentyl, gabapentin, ibuprofen and Percocet.   REVIEW OF SYSTEMS: Grossly negative except for what is mentioned in the history of present illness. Denies any rectal bleeding, hematochezia, nausea, vomiting or hematemesis.   PHYSICAL EXAMINATION: GENERAL: Chronically ill appearing male, appears shaky. He is afebrile, somewhat tachycardic with a heart rate in between 90 to 100, blood pressure ranging anywhere from 78 to 210/50 to 70 clinically he is not jaundiced.  NECK: Neck veins are flat. Poor overall hygiene.  LUNGS: Clear to auscultation with few scattered wheezes bilaterally.  CARDIOVASCULAR: Tachycardia, regular rate and rhythm.  ABDOMEN: Sluggish bowel sounds. Abdomen is otherwise soft and benign. No  hepatosplenomegaly or ascites were noted.   NEUROLOGIC: Except for him being somewhat shaky and probably going into DTs, neurological examination appears to be grossly unremarkable.   LABORATORY, DIAGNOSTIC, AND RADIOLOGICAL DATA: White cell count was 26,000 on admission, is 23,000 today. Hemoglobin is 12.3, platelet count normal at 236. Serum lipase 1400, ALT 44, AST 61, alkaline phosphatase 240. Total bilirubin of 3 yesterday. Total bilirubin is down to 2.5 today.   CT scan of the abdomen and pelvis showed prior cholecystectomy, pancreatic duct dilatation, multiple pancreatic cysts, fluid around the pancreas and around the psoas muscle, consistent with pancreatitis. No other significant acute intra-abdominal pathologies were noted.   ASSESSMENT AND PLAN:  1.  The patient is with what appears to be acute pancreatitis with high lipase, abdominal pain. The patient has history of prior pancreatitis and probably suffers from chronic pancreatitis as well.  2.  Abnormal liver enzymes. This is probably secondary to alcoholic liver disease as evidenced by AST, ALT ratio. His bilirubin seems to be improving. Ultrasound showed slightly dilated common bile duct, which is not uncommon in status post cholecystectomy state. I doubt cholangitis or biliary obstruction. The patient also has leukocytosis, which is also seen common in patients with acute alcoholic hepatitis. Pancreatitis can also contribute to leukocytosis. The patient has no right upper quadrant tenderness and he clinically he is not jaundiced and therefore ascending cholangitis unlikely.   RECOMMENDATIONS: I agree with intravenous antibiotics, keep nothing by mouth for now, IV hydration and pain control. We will obtain CA-19-9, follow LFTs. As the patient's abdominal pain improves, he can be started on clear liquid diet. Further recommendations to follow.      ____________________________ Lurline Del, MD si:cc D: 11/18/2012 19:14:55  ET T: 11/18/2012 23:36:39 ET JOB#:  [id number]  cc: Lurline DelShaukat Iftikhar, MD, <Dictator> Lurline DelSHAUKAT IFTIKHAR MD ELECTRONICALLY SIGNED 11/19/2012 8:07

## 2014-12-30 NOTE — Consult Note (Signed)
Chief Complaint:  Subjective/Chief Complaint Event of the weekend noted. Just had a vomiting episode.   VITAL SIGNS/ANCILLARY NOTES: **Vital Signs.:   21-Apr-14 07:57  Vital Signs Type Routine  Temperature Temperature (F) 98.1  Celsius 36.7  Temperature Source oral  Pulse Pulse 96  Respirations Respirations 21  Systolic BP Systolic BP 117  Diastolic BP (mmHg) Diastolic BP (mmHg) 75  Mean BP 89  Pulse Ox % Pulse Ox % 97  Pulse Ox Activity Level  At rest  Oxygen Delivery 2L  Pulse Ox Heart Rate 110   Brief Assessment:  Cardiac Regular   Respiratory clear BS   Gastrointestinal Distended with ascites. Tender   Lab Results: Routine Chem:  20-Apr-14 05:31   Glucose, Serum 82  BUN  2  Creatinine (comp)  0.50  Sodium, Serum  134  Potassium, Serum 3.5  Chloride, Serum 99  CO2, Serum 30  Calcium (Total), Serum  7.2  Anion Gap  5  Osmolality (calc) 264  eGFR (African American) >60  eGFR (Non-African American) >60 (eGFR values <60mL/min/1.73 m2 may be an indication of chronic kidney disease (CKD). Calculated eGFR is useful in patients with stable renal function. The eGFR calculation will not be reliable in acutely ill patients when serum creatinine is changing rapidly. It is not useful in  patients on dialysis. The eGFR calculation may not be applicable to patients at the low and high extremes of body sizes, pregnant women, and vegetarians.)  Routine Hem:  20-Apr-14 05:31   WBC (CBC)  21.4  RBC (CBC)  2.31  Hemoglobin (CBC)  8.3  Hematocrit (CBC)  24.8  Platelet Count (CBC) 178  MCV  107  MCH  36.0  MCHC 33.6  RDW  17.7  Neutrophil % 83.8  Lymphocyte % 7.3  Monocyte % 7.4  Eosinophil % 0.9  Basophil % 0.6  Neutrophil #  17.9  Lymphocyte # 1.6  Monocyte #  1.6  Eosinophil # 0.2  Basophil # 0.1 (Result(s) reported on 27 Dec 2012 at 05:49AM.)   Assessment/Plan:  Assessment/Plan:  Assessment SBP. On Abx. Ascites. WBC slowly coming down.   Plan Needs  therapeutic paracentesis. Scheduled for tomorrow. Continue Abx. Nausea meds. If vomiting persists, switch to NPO or clears. I will be in [place] tomorrow. Will check back on Wed. Thanks.   Electronic Signatures: Oh, Paul (MD)  (Signed 21-Apr-14 12:43)  Authored: Chief Complaint, VITAL SIGNS/ANCILLARY NOTES, Brief Assessment, Lab Results, Assessment/Plan   Last Updated: 21-Apr-14 12:43 by Oh, Paul (MD) 

## 2014-12-30 NOTE — Consult Note (Signed)
PATIENT NAME:  Craig Mitchell, Craig Mitchell MR#:  [id number] DATE OF BIRTH:  04-16-1951  DATE OF CONSULTATION:  12/22/2012  REFERRING PHYSICIAN:   CONSULTING PHYSICIAN:  Adah Salvageichard E. Excell Seltzerooper, MD  CHIEF COMPLAINT: Central abdominal pain.   HISTORY OF PRESENT ILLNESS: This is a 64 year old male patient with a history of alcoholic liver disease. Recent admission required intubation and his family states that he was in kidney failure, liver failure and respiratory failure and was on the ventilator for several days. He presents today with 4 days of abdominal pain. It has been waxing and waning. It is not any worse today than it was that the day that it started, but it never goes away. It does wax and wane. He has had no fevers or chills. He has been able to eat and has had bowel movements. He is passing gas and had multiple bowel movements today, one of which was diarrheal. No blood or black tarry color.   Dr. Cyril LoosenKinner in the Emergency Room had obtained a flat plate abdomen film and thought that he might have a bowel obstruction and asked for a surgical consultation.   PAST MEDICAL HISTORY: Alcoholic liver disease, chronic pancreatitis and colon cancer.   PAST SURGICAL HISTORY: Colon resection and cholecystectomy performed laparoscopically last September by Dr. Juliann PulseLundquist. He has also had knee surgery.   SOCIAL HISTORY: The patient is a chronic alcoholic, but he states that he stopped drinking at his last hospitalization 2 or 3 weeks ago. He had been drinking beer daily to a large volume. He also smokes tobacco and is not employed.   FAMILY HISTORY: Noncontributory.   MEDICATIONS: Multiple, see chart.   ALLERGIES: None.   REVIEW OF SYSTEMS: A 10 system review is performed and negative with the exception of that mentioned in the HPI.   PHYSICAL EXAMINATION:  GENERAL: Chronically ill-appearing disheveled male patient.  VITAL SIGNS: In the Emergency Room showed a temp of 98.3, pulse of 98, respirations 20, blood  pressure 78/56 which was up from 54/34 after fluid boluses and his pain scale was 7.  HEENT: Poor dentition. No scleral icterus. Ruddy complexion.  CHEST: Bilateral rhonchi with some wheezing.  CARDIAC: Regular rate and rhythm.  ABDOMEN: Distended. There is a long complex midline scar, which is well healed, but beneath that are multiple multiloculated ventral hernias which are soft, nontender and reducible. The abdomen is diffusely tender without guarding or rebound or percussion tenderness.  EXTREMITIES: Moderate edema.  GENITOURINARY: Edema as well. A Foley catheter is in place.  NEUROLOGIC: Grossly intact.  INTEGUMENT: No jaundice.   LABORATORY VALUES: Demonstrate a white blood cell count 53,000, hemoglobin and hematocrit of 8.8 and 26 and a platelet count 288. Sodium 130, potassium 3.2, bicarbonate 24, calcium of 6.4 with an albumin of 1.1 and a total protein of 3.7 and lipase 126. CT scan is personally reviewed. There is considerable ascites. No sign of bowel obstruction. Pleural effusions mostly on the right side and possible right lower lobe pneumonia. Of interest and not mentioned in the CT report is, what I believe to be, loculated fluid collection in the retroperitoneal space and the perinephric space on the right.   ASSESSMENT AND PLAN: This is a patient with abdominal pain with no peritoneal irritation findings to suggest peritonitis, although he is tender enough that he could have spontaneous bacterial peritonitis. I spoke to Dr. Cyril LoosenKinner and to  Dr. Rudene Rearwish about this possibility and that he may require a paracentesis. I also discussed with Dr. Rudene Rearwish  this finding that is not mentioned in the CT scan report of a retroperitoneal fluid collection. This may just be portion of his anasarca. It perinephric on the right side. I will ask Dr. Egbert Garibaldi in the morning to review these films with the radiologist to discuss the significance of this finding, but I do believe this patient will require a  paracentesis. I will be happy to follow the patient with the admitting team while the patient is in the hospital.    ____________________________ Adah Salvage. Excell Seltzer, MD rec:aw D: 12/23/2012 06:40:53 ET T: 12/23/2012 06:57:05 ET JOB#: [id number]  cc: Adah Salvage. Excell Seltzer, MD, <Dictator> Lattie Haw MD ELECTRONICALLY SIGNED 12/23/2012 20:36

## 2014-12-30 NOTE — Discharge Summary (Signed)
PATIENT NAME:  Craig Mitchell, Craig Mitchell MR#:  [id number] DATE OF BIRTH:  [birth year]  DATE OF ADMISSION:  12/22/2012 DATE OF DISCHARGE:  12/31/2012  NOTE:  Please see interim discharge summary dictated by Dr. Auburn Bilberry on 12/28/2012 for detailed course from admission until 12/28/2012.   DISCHARGE DIAGNOSES: 1.  Septic shock, now resolved, likely due to spontaneous bacterial peritonitis.  None grew on ascitic fluid and was consistent with significant inflammation. No pneumonia.  2.  Hypoxic respiratory failure likely due to aspiration. No pneumonia, likely chemical event, could have diastolic heart failure, now improving on Lasix.  3.  Hypokalemia due to vomiting and poor p.o. intake.  4.  Ileus and small bowel obstruction, now COMFORT CARE.  5.  Acute encephalopathy, now improving, metabolic in nature due to sedative medication, hypoxia and hepatic encephalopathy. Lactulose was stopped due to rule out of diarrhea.  6.  Anasarca, status post multiple albumin infusions and IV Lasix with decrease in swelling.  7.  Spontaneous bacterial peritonitis, status post paracentesis,  likely due to underlying cirrhosis, 8.  Anemia, likely due to anemia of chronic disease, now stable.  9.  Liver cirrhosis with very poor prognosis, was discharged home with hospice services.  10.  Right lower lobe atelectasis.  11.  Hypoglycemia due to liver disease and poor p.o. intake.  12.  Chronic pancreatitis, now stable, still has abdominal pain.   SECONDARY DIAGNOSES: 1.  Chronic alcoholism.  2.  Chronic liver disease.  3.  Chronic alcoholic pancreatitis.  4.  Crohn's disease.  5.  History of elevated CA-19-9.   CONSULTATIONS: As dictated by Dr. Auburn Bilberry in the interim discharge summary.  No new consultations were obtained subsequent to that.   LABORATORY AND RADIOLOGICAL DATA: As dictated by Dr. Auburn Bilberry.  In addition, the patient had an abdominal 3-way x-ray on the 12/28/2012 which showed ileus versus  early or partial small bowel obstruction.  KUB on the 22nd of April showed small bowel obstruction.  Major laboratory panel: As dictated by Auburn Bilberry in the interim discharge summary. In addition, stool for C. difficile was negative on 12/30/2012.   HISTORY AND SHORT HOSPITAL COURSE: The patient is a 64 year old male with the above-mentioned medical problems, was admitted for abdominal pain and was thought to have sepsis secondary to spontaneous bacterial peritonitis. Please see Dr. Riley Nearing dictated history and physical for further details. Please see Dr. Serita Grit Patel's dictated interim discharge summary on 12/28/2012 for a detailed hospital course from admission until 12/28/2012. Subsequent to that, the patient started having nausea and vomiting along with worsening abdominal pain for which abdominal x-ray was obtained which showed ileus versus small bowel obstruction.  This seemed to be getting worse for which Palliative Care was reconsulted, and GI along with Surgery were in agreement to make him COMFORT CARE ONLY. The patient was not felt to be a good surgical candidate either way. The patient was slowly declining and was in extremely poor condition with very poor prognosis, was eligible for hospice services and was discharged home on 12/31/2012 in poor condition.   PERTINENT DISCHARGE PHYSICAL EXAMINATION: VITAL SIGNS: On the date of discharge, his vital signs were Temperature 98.2, heart rate 92 per minute, respirations 18 per minute, blood pressure 127/71 mmHg.  He was  saturating 98% on 2 liters oxygen via nasal cannula.  CARDIOVASCULAR: On the date of discharge, S1, S2 normal. No murmurs, rubs or gallops.  LUNGS: Decreased breath sounds at the bases bilaterally. He was lying in the  bed, was awake and answering questions, although at times he was very confused when talking.  ABDOMEN: Distended with hypoactive bowel sounds and tenderness throughout the belly. He also had suprapubic  tenderness.  EXTREMITIES: With significant edema.  SKIN: He had an erythematous scaling rash in bilateral feet in the shoe distribution.  NEUROLOGICAL: He would follow commands and moving extremities voluntarily.  PSYCHIATRIC:  He was alert but confused and very critically ill-appearing.  All other physical examination remained at the baseline.   DISCHARGE MEDICATIONS: 1.  Celexa 40 mg p.o. daily.  2.  Desipramine 25 mg 2 tablets p.o. daily.  3.  Gabapentin 300 mg 2 capsules p.o. 3 times a day.  4.  Cyanocobalamin 1000 mcg intramuscular once a month.  5.  Magnesium oxide 400 mg p.o. b.i.d.  6.  Lasix 20 mg p.o. b.i.d.  7.  Oxycodone 10 mg p.o. 3 times a day.  8.  Omeprazole 20 mg p.o. b.i.d.  9.  Ibuprofen 400 mg p.o. every 4 to 6 hours as needed.  10.  Sucralfate 1 gram p.o. 4 times a day.  11.  Tizanidine 4 mg p.o. 3 times a day as needed.  12.  Nystatin topical to affected 4 times a day.  13.  Spironolactone 25 mg p.o. daily.   DISCHARGE DIET: Regular. Please add Ensure Plus in his diet 3 times a day.  Consistency will be mechanical soft with thin liquids, general aspiration and reflux precautions to be maintained.  Moisten food well, smaller frequent meals to aid with digestion and clearing.   DISCHARGE ACTIVITY: As tolerated.   DISCHARGE FOLLOWUP:  The patient was instructed to follow up with his new primary care physician in 1 to 2 weeks.   TOTAL TIME DISCHARGING THIS PATIENT: 55 minutes.   ____________________________ Ellamae SiaVipul S. Sherryll BurgerShah, MD vss:cb D: 01/04/2013 13:37:00 ET T: 01/04/2013 15:38:13 ET JOB#: [id number]  cc: Jareb Radoncic S. Sherryll BurgerShah, MD, <Dictator> Ned GraceNancy Phifer, MD Rosalyn GessMichael E. Blocker, MD Carmie Endalph L. Ely III, MD Midge Miniumarren Wohl, MD Primary Care Physician Ellamae SiaVIPUL S [facility] MD ELECTRONICALLY SIGNED 01/04/2013 18:22

## 2014-12-30 NOTE — Consult Note (Signed)
   Comments   I met with pt in the presence of his wife, daughter and pastor. They confirm that they want pt to go home with hospice. I readdressed code status with pt. He asks appropriate questions, wants to discuss further with his family. Hospice can follow up as outpt. For now, remains a full code.   Electronic Signatures: Abdelaziz Westenberger, Izora Gala (MD)  (Signed 23-Apr-14 15:15)  Authored: Palliative Care   Last Updated: 23-Apr-14 15:15 by Raychell Holcomb, Izora Gala (MD)

## 2014-12-30 NOTE — Consult Note (Signed)
PATIENT NAME:  Craig Mitchell, Craig Mitchell MR#:  [id number] DATE OF BIRTH:  03/23/1951  DATE OF CONSULTATION:  11/25/2012  REFERRING PHYSICIAN:  Shreyang H. Allena KatzPatel, MD CONSULTING PHYSICIAN:  Rosalyn GessMichael E. Chrisandra Wiemers, MD  REASON FOR CONSULTATION:  Diarrhea and leukocytosis.   HISTORY OF PRESENT ILLNESS:  The patient is a 64 year old male with a past history significant for alcoholism with alcoholic pancreatitis, Crohn'Mitchell disease, colon cancer status post partial resection and chemotherapy who was admitted on 11/17/2012 with abdominal pain, nausea, weakness and lower extremity edema. The patient was found to have an elevated white count and a high lipase. He was admitted for pancreatitis. He developed respiratory failure while in house and required intubation. He was initially empirically treated with Zosyn. His white count came down over time and his respiratory status improved. His white count had gone from 23.9 down to 12.3 yesterday; however today, his white count was up to 21.2 with an ANC of 19.2. He was extubated yesterday and is breathing comfortably. He is also eating very well. The patient states that he had a syncopal episode prior to admission and significant abdominal pain. He had been having some chronic diarrhea for several weeks. He had undergone upper and lower endoscopy by Dr. Marva PandaSkulskie in January 2014 which showed chronic gastritis, a tubular adenoma and chronic inflammation of the lamina propria. He was started on Entocort which he had been taking only intermittently before that. Since being in the hospital, he has off the Entocort. He is currently feeling well with no pain, no nausea, no vomiting. He has a rectal tube in place with loose stool. He is eating very well. He states he is breathing comfortably as well.   ALLERGIES:  None.   PAST MEDICAL HISTORY:   1.  Alcohol abuse.  2.  Hypertension.  3.  Colon cancer status post partial colectomy and chemotherapy.  4.  Crohn'Mitchell disease.  5.  Alcoholic  pancreatitis.  6.  Gallstone pancreatitis status post cholecystectomy.   SOCIAL HISTORY:  The patient lives with his wife. He smokes a half a pack of cigarettes per day. He has been a heavy drinker for a long period of time.   FAMILY HISTORY:  Positive for lung cancer in his mother, coronary artery disease in his mother, alcohol abuse in his son.   REVIEW OF SYSTEMS:  GENERAL: No current fevers, chills or sweats.  HEENT: No headaches. No sinus congestion. No sore throat.  NECK: No stiffness. No swollen glands.  RESPIRATORY: No cough. No shortness of breath. No sputum production.  CARDIAC: No chest pains or palpitations. He has had some peripheral edema prior to admission.  GASTROINTESTINAL: No current nausea or vomiting. No abdominal pain. Eating well. Positive loose stool with a rectal tube in place. Prior to admission, he was having significant abdominal pain with nausea and vomiting and was not eating at all for the last several weeks.  GENITOURINARY: No complaints.  MUSCULOSKELETAL: He was having general malaise and myalgias, but no frank arthritis prior to admission. These symptoms have improved.  SKIN: He has had some bruising around the neck and chest area.  NEUROLOGIC: Currently, no focal weakness. He did have generalized weakness prior to admission, but this has improved. 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  PSYCHIATRIC: No complaints. All other systems are negative.   PHYSICAL EXAMINATION: VITAL SIGNS: T-max of 100.2, T-current 96.1, pulse 102, blood pressure 162/94, 96% on 2 liters.  GENERAL: A 64 year old white male in no acute distress.  HEENT: Normocephalic, atraumatic. Pupils equal, reactive to light. Extraocular motion intact. Sclerae, conjunctivae and lids are without evidence for emboli or petechiae. Oropharynx shows no erythema or exudate. Gums are in fair condition.  NECK: Supple. Full range of motion. Midline trachea. No lymphadenopathy. No  thyromegaly.  LUNGS: Clear to auscultation bilaterally with good air movement. No focal consolidation.  HEART: Regular rate and rhythm without murmur, rub or gallop.  ABDOMEN: Soft, nontender and nondistended. No hepatosplenomegaly. No hernia is noted.  EXTREMITIES: No evidence for tenosynovitis. He has mild bilateral lower extremity edema.  SKIN: He has ecchymoses over the neck and chest. No other rashes. No stigmata of endocarditis, specifically no Janeway lesions or Osler nodes.  NEUROLOGIC: The patient was awake and interactive, moving all 4 extremities.  PSYCHIATRIC: Mood and affect appeared normal.   DIAGNOSTIC DATA:  BUN 20, creatinine 0.68, potassium 2.8, bicarbonate 26, anion gap of 10. LFTs showed an AST of 46, ALT 36, alkaline phosphatase of 205, total bilirubin of 1.2 on 11/23/2012. On admission, his AST was 61, ALT 44, alkaline phosphatase 240, total bilirubin 3.0. His lipase on admission was 1417 and has come down to normal, although yesterday was up slightly to 217. His white count is 21.2 with a hemoglobin of 8.5, platelet count of 205, ANC of 19.2. White count was 26.8 on admission and came down to as low as 12.3 yesterday. C. difficile PCR from yesterday was negative. Blood cultures from admission showed no growth. A urinalysis from admission was unremarkable. An abdominal ultrasound from admission showed dilated pancreatic duct, hepatic steatosis and a small amount of ascites. The common bile duct was mildly dilated. A chest x-ray from admission showed no acute cardiopulmonary disease. A CT scan of the abdomen and pelvis with contrast showed multiple small cystic lesions in the pancreatic head and pancreatic body. There was wall thickening of the gastric body. There was fluid extending inferiorly along the left psoas muscle that could represent changes related to pancreatitis. A CT scan of the abdomen and pelvis with contrast today demonstrates inflammatory changes in the soft tissue  surrounding the pancreatic head as well as the substance of the pancreatic head itself. There is inflammatory change in the porta hepatis, moderate amount of ascites in the upper abdomen, no bowel obstruction or ileus. There is a moderate amount of stool in the rectosigmoid area. No bowel wall thickening. There are moderate sized pleural effusions.   IMPRESSION:  A 64 year old male with history of alcohol abuse, pancreatitis, Crohn'Mitchell disease, colon cancer status post colectomy and chemotherapy admitted with pancreatitis and respiratory failure who has an increased white count and diarrhea.   RECOMMENDATIONS:   1.  He is doing extremely well clinically. He has been extubated and is now eating without any pain. He is afebrile. His white count which was high on admission had been coming down until today when it was elevated again. He has persistent loose stool that was ongoing prior to his admission. He recently had upper and lower endoscopy which showed some chronic active inflammation. He was recently restarted on Entocort by Dr. Marva Panda, but is not taking this currently.  2.  His C. difficile PCR was negative.  3.  Given his chronic pancreatitis, he may have pancreatic insufficiency. This could lead to undigested fat in the stool which can cause diarrhea and foul odor.  4.  I discussed his care with Dr. Marva Panda who is his primary gastroenterologist. We will restart Entocort and start Creon therapy.  5.  We will discontinue the metronidazole.  6.  He was on Zosyn for his pancreatitis, but this has been stopped. There is evidence that broad-spectrum antibiotics and pancreatitis can prevent infection of pseudocysts. He appears to be clinically improving and would not restart the Zosyn at the current time.  7.  We will recheck his CBC tomorrow.   This is a high-level infectious disease consult.   Thank you very much for involving me in this patient'Mitchell care.    ____________________________ Rosalyn Gess.  Gianlucas Evenson, MD meb:si D: 11/25/2012 15:57:00 ET T: 11/25/2012 16:23:25 ET JOB#: [id number]  cc: Rosalyn Gess. Arthur Aydelotte, MD, <Dictator> Leemon Ayala E Bannie Lobban MD ELECTRONICALLY SIGNED 11/27/2012 14:11

## 2014-12-30 NOTE — Consult Note (Signed)
Brief Consult Note: Diagnosis: Pancreatitis.   Patient was seen by consultant.   Discussed with Attending MD.   Comments: Acute pancreatitis. Abnormal LFT's with abdominal pain. No fever. Positive leucocytosis. Normal CBD for post cholecystectomy state. Dilated PD most likely chronic pancreatitis.  Probable ETOH hepatitis which will explain hyperbilirubinemia and leucocytosis as well.  Recommendations: NPO. IV hydration. Pain control. Agree with IV antibiotics and CT scan. Further recommendations to follow.  Electronic Signatures: Lurline DelIftikhar, Shaukat (MD)  (Signed 11-Mar-14 20:50)  Authored: Brief Consult Note   Last Updated: 11-Mar-14 20:50 by Lurline DelIftikhar, Shaukat (MD)

## 2014-12-30 NOTE — Consult Note (Signed)
Chief Complaint:  Subjective/Chief Complaint Extubated. Denies abdominal pain. Tolerating regular diet.   VITAL SIGNS/ANCILLARY NOTES: **Vital Signs.:   18-Mar-14 16:00  Vital Signs Type Routine  Temperature Temperature (F) 97.9  Celsius 36.6  Pulse Pulse 66  Respirations Respirations 9  Systolic BP Systolic BP 115  Diastolic BP (mmHg) Diastolic BP (mmHg) 64  Mean BP 81  Pulse Ox % Pulse Ox % 94  Pulse Ox Activity Level  At rest  Oxygen Delivery 2L  Pulse Ox Heart Rate 66   Brief Assessment:  Additional Physical Exam Abdomen is soft and benign.   Assessment/Plan:  Assessment/Plan:  Assessment ETOH pancreatitis, clinically resolved.   Plan No further GI recommendations. Will sign off. Please call on call GI if needed.   Electronic Signatures: Lurline DelIftikhar, Gunnard Dorrance (MD)  (Signed [phone]-Mar-14 23:28)  Authored: Chief Complaint, VITAL SIGNS/ANCILLARY NOTES, Brief Assessment, Assessment/Plan   Last Updated: 18-Mar-14 23:28 by Lurline DelIftikhar, Kalley Nicholl (MD)

## 2014-12-30 NOTE — Consult Note (Signed)
Chief Complaint:  Subjective/Chief Complaint Feels better. Denies significant abdominal pain.   VITAL SIGNS/ANCILLARY NOTES: **Vital Signs.:   13-Mar-14 04:25  Vital Signs Type Routine  Temperature Temperature (F) 97.9  Celsius 36.6  Temperature Source axillary  Pulse Pulse 114  Respirations Respirations 18  Systolic BP Systolic BP 90  Diastolic BP (mmHg) Diastolic BP (mmHg) 54  Mean BP 66  Pulse Ox % Pulse Ox % 91  Pulse Ox Activity Level  At rest  Oxygen Delivery Room Air/ 21 %   Brief Assessment:  Additional Physical Exam Abdomen is soft and non tender. BS positive.   Lab Results: Hepatic:  13-Mar-14 06:10   Bilirubin, Total  1.7  Bilirubin, Direct  1.1 (Result(s) reported on 19 Nov 2012 at [facility].)  Alkaline Phosphatase  184  SGPT (ALT) 30  SGOT (AST)  51  Total Protein, Serum  4.2  Albumin, Serum  1.1  Routine Chem:  13-Mar-14 06:10   Magnesium, Serum 2.1 (1.8-2.4 THERAPEUTIC RANGE: 4-7 mg/dL TOXIC: > 10 mg/dL  -----------------------)  Glucose, Serum  48  BUN 11  Creatinine (comp) 1.27  Sodium, Serum 137  Potassium, Serum 4.2  Chloride, Serum  108  CO2, Serum  19  Anion Gap 10  Osmolality (calc) 270  eGFR (African American) >60  eGFR (Non-African American) >60 (eGFR values <64m/min/1.73 m2 may be an indication of chronic kidney disease (CKD). Calculated eGFR is useful in patients with stable renal function. The eGFR calculation will not be reliable in acutely ill patients when serum creatinine is changing rapidly. It is not useful in  patients on dialysis. The eGFR calculation may not be applicable to patients at the low and high extremes of body sizes, pregnant women, and vegetarians.)  Lipase 270 (Result(s) reported on 19 Nov 2012 at 06:58AM.)   Assessment/Plan:  Assessment/Plan:  Assessment Pancreatitis. Clinically better. Lipase is normal. Hepatitis. LFT's are improving.   Plan Clear liquid diet. I will be out of town for the rest of the  day and return tomorrow. Please call on call GI if needed. Thanks.   Electronic Signatures: IJill Side(MD)  (Signed 13-Mar-14 08:00)  Authored: Chief Complaint, VITAL SIGNS/ANCILLARY NOTES, Brief Assessment, Lab Results, Assessment/Plan   Last Updated: 13-Mar-14 08:00 by IJill Side(MD)

## 2014-12-30 NOTE — Consult Note (Signed)
Impression:    64yo male w/ h/o alcohol abuse, pancreatitis, Crohn's disease, colon CA, s/p partial colectomy and chemo admitted with pancreatitis and respiratory failure who has increased WBC and diarrhea.     He is doing extremely well.  He has been extubated and his now eating without any pain. He is afebrile. His WBC which was high on admission, had been been coming down until today when it was elevated again.  He has had persistent loose stool that was ongoing prior to his admission.    He recently had upper and lower endoscopy which showed some chronic actpseudocysts.  He appears to be clinically improving and would not restart the zosyn.flammationcolitis.  He was started on entacort by Dr. Marva PandaSkulskie.  He is not taking this currently.    His C. diff PCR was negative.    Given his chronic pancreatitis, he may have pancreatic insufficiency.  This would lead to undigested fat in the stool which can cause diarrhea and foul odor.    Discussed with Dr. Marva PandaSkulskie.  Will restart entacort and will start creon therapy.    d/c metronidazole.    He was on zosyn for his pancreatitis, but this has been stopped.  There is evidence that broad spectrum antibiotics in pancreatitis can prevent infection of pseudocysts.  He appears to be clinically improving and would not restart the zosyn. 8)     Follow CBC.  Electronic Signatures: Malaia Buchta MPH, Rosalyn GessMichael E (MD) (Signed on 19-Mar-14 15:44)  Authored   Last Updated: 19-Mar-14 15:57 by Leanthony Rhett MPH, Rosalyn GessMichael E (MD)

## 2014-12-30 NOTE — Consult Note (Signed)
Chief Complaint:  Subjective/Chief Complaint No more vomiting. Abd still distended. Eating right now. Paracentesis could not be done due to elevated INR. Hospice getting involved.   VITAL SIGNS/ANCILLARY NOTES: **Vital Signs.:   23-Apr-14 07:50  Vital Signs Type Routine  Temperature Temperature (F) 97.5  Celsius 36.3  Temperature Source axillary  Pulse Pulse 90  Respirations Respirations 21  Systolic BP Systolic BP 121  Diastolic BP (mmHg) Diastolic BP (mmHg) 83  Mean BP 95  Pulse Ox % Pulse Ox % 94  Pulse Ox Activity Level  At rest  Oxygen Delivery 2L   Brief Assessment:  Cardiac Regular   Respiratory clear BS   Gastrointestinal Distended. Tender   Lab Results: Routine Chem:  23-Apr-14 09:27   Result Comment LABS - This specimen was collected through an   - indwelling catheter or arterial line.  - A minimum of 5mls of blood was wasted prior    - to collecting the sample.  Interpret  - results with caution.  Result(s) reported on 30 Dec 2012 at 09:41AM.  Routine Hem:  23-Apr-14 09:27   WBC (CBC)  19.9  RBC (CBC)  2.40  Hemoglobin (CBC)  8.5  Hematocrit (CBC)  25.5  Platelet Count (CBC) 262  MCV  106  MCH  35.5  MCHC 33.4  RDW  18.3  Neutrophil % 85.5  Lymphocyte % 7.0  Monocyte % 5.6  Eosinophil % 0.6  Basophil % 1.3  Neutrophil #  17.0  Lymphocyte # 1.4  Monocyte #  1.1  Eosinophil # 0.1  Basophil #  0.3   Radiology Results: XRay:    22-Apr-14 02:02, KUB - Kidney Ureter Bladder  KUB - Kidney Ureter Bladder   REASON FOR EXAM:    projectal vomiting  COMMENTS:       PROCEDURE: DXR - DXR KIDNEY URETER BLADDER  - Dec 29 2012  2:02AM     RESULT: Abdominal images show distended loops of small bowel concerning   for obstruction. There is in loops of colon and inthe rectum. No free   air or pneumatosis is evident.    IMPRESSION:  Findings concerning for small bowel obstruction. CT is   recommended.    Dictation Site: 2      Verified By: Elveria RoyalsGEOFFREY  H. BROWNE, M.D., MD   Assessment/Plan:  Assessment/Plan:  Assessment SBP. Pnemonia? Ileus.   Plan Continue Abx. Will see how he does with solids again. Since paracentesis cannot be done, add aldactone to lasix to decrease ascites. May go home soon with hospice.   Electronic Signatures: Craig Mitchell, Craig Mitchell (MD)  (Signed 23-Apr-14 14:33)  Authored: Chief Complaint, VITAL SIGNS/ANCILLARY NOTES, Brief Assessment, Lab Results, Radiology Results, Assessment/Plan   Last Updated: 23-Apr-14 14:33 by Craig Mitchell, Craig Mitchell (MD)

## 2014-12-30 NOTE — Consult Note (Signed)
Chief Complaint:  Subjective/Chief Complaint patient reports that his abd. still hurts. He is eating well and says the pain is not any worse but not much better. WBC's down to 22 today.   VITAL SIGNS/ANCILLARY NOTES: **Vital Signs.:   19-Apr-14 08:00  Vital Signs Type Routine  Temperature Temperature (F) 99.5  Celsius 37.5  Temperature Source oral  Pulse Pulse 100  Respirations Respirations 20  Systolic BP Systolic BP 110  Diastolic BP (mmHg) Diastolic BP (mmHg) 70  Mean BP 83  Pulse Ox % Pulse Ox % 91  Pulse Ox Activity Level  At rest  Oxygen Delivery Room Air/ 21 %   Brief Assessment:  Respiratory normal resp effort   Gastrointestinal details normal Soft  Tender, no rebound not gaurding   Additional Physical Exam NAD. A&O x 3   Lab Results:  Routine Chem:  19-Apr-14 05:04   Result Comment LABS - This specimen was collected through an   - indwelling catheter or arterial line.  - A minimum of 5mls of blood was wasted prior    - to collecting the sample.  Interpret  - results with caution.  Result(s) reported on 26 Dec 2012 at 06:01AM.  Result Comment LABS - This specimen was collected through an   - indwelling catheter or arterial line.  - A minimum of 5mls of blood was wasted prior    - to collecting the sample.  Interpret  - results with caution.  Result(s) reported on 26 Dec 2012 at 05:41AM.  Ammonia, Plasma  41  Glucose, Serum 68  BUN  2  Creatinine (comp)  0.46  Sodium, Serum  132  Potassium, Serum  3.3  Chloride, Serum 98  CO2, Serum 28  Calcium (Total), Serum  7.2  Anion Gap  6  Osmolality (calc) 259  eGFR (African American) >60  eGFR (Non-African American) >60 (eGFR values <60mL/min/1.73 m2 may be an indication of chronic kidney disease (CKD). Calculated eGFR is useful in patients with stable renal function. The eGFR calculation will not be reliable in acutely ill patients when serum creatinine is changing rapidly. It is not useful in  patients on  dialysis. The eGFR calculation may not be applicable to patients at the low and high extremes of body sizes, pregnant women, and vegetarians.)  Routine Hem:  19-Apr-14 05:04   WBC (CBC)  22.0  RBC (CBC)  2.32  Hemoglobin (CBC)  8.5  Hematocrit (CBC)  24.9  Platelet Count (CBC) 181  MCV  107  MCH  36.7  MCHC 34.3  RDW  17.2  Neutrophil % 86.1  Lymphocyte % 7.1  Monocyte % 6.0  Eosinophil % 0.6  Basophil % 0.2  Neutrophil #  18.9  Lymphocyte # 1.6  Monocyte #  1.3  Eosinophil # 0.1  Basophil # 0.1 (Result(s) reported on 26 Dec 2012 at 05:43AM.)   Assessment/Plan:  Assessment/Plan:  Assessment SBP.   Plan Improving. Still with abd pain. Continue current treatment.   Electronic Signatures: Wohl, Darren (MD)  (Signed 19-Apr-14 08:46)  Authored: Chief Complaint, VITAL SIGNS/ANCILLARY NOTES, Brief Assessment, Lab Results, Assessment/Plan   Last Updated: 19-Apr-14 08:46 by Wohl, Darren (MD) 

## 2014-12-30 NOTE — Consult Note (Signed)
Chief Complaint:  Subjective/Chief Complaint SOB and still c/o abd pain. WBC coming down though still high. Mildly confused.   VITAL SIGNS/ANCILLARY NOTES: **Vital Signs.:   17-Apr-14 11:00  Temperature Temperature (F) 100.4  Celsius 38  Pulse Pulse 116  Respirations Respirations 20  Systolic BP Systolic BP 530  Diastolic BP (mmHg) Diastolic BP (mmHg) 58  Mean BP 74  Pulse Ox % Pulse Ox % 91  Pulse Ox Heart Rate 110   Brief Assessment:  Cardiac Regular   Respiratory rhonchi   Gastrointestinal abd distension less than yest but diffusely tender   Lab Results:  Hepatic:  17-Apr-14 05:34   Bilirubin, Total 0.7  Alkaline Phosphatase 86  SGPT (ALT) 14  SGOT (AST) 21  Total Protein, Serum  4.6  Albumin, Serum  2.3  TDMs:  17-Apr-14 05:34   Vancomycin, Trough LAB 18 (Result(s) reported on 24 Dec 2012 at 06:46AM.)  Routine Chem:  17-Apr-14 05:34   Glucose, Serum 77  BUN  6  Creatinine (comp) 0.67  Sodium, Serum  130  Potassium, Serum 3.5  Chloride, Serum  97  CO2, Serum 27  Calcium (Total), Serum  7.2  Osmolality (calc) 257  eGFR (African American) >60  eGFR (Non-African American) >60 (eGFR values <17m/min/1.73 m2 may be an indication of chronic kidney disease (CKD). Calculated eGFR is useful in patients with stable renal function. The eGFR calculation will not be reliable in acutely ill patients when serum creatinine is changing rapidly. It is not useful in  patients on dialysis. The eGFR calculation may not be applicable to patients at the low and high extremes of body sizes, pregnant women, and vegetarians.)  Anion Gap  6  Routine Hem:  17-Apr-14 05:34   WBC (CBC)  27.7  RBC (CBC)  2.18  Hemoglobin (CBC)  8.0  Hematocrit (CBC)  23.6  Platelet Count (CBC) 237  MCV  108  MCH  36.5  MCHC 33.8  RDW  16.7  Neutrophil % 89.6  Lymphocyte % 5.2  Monocyte % 4.1  Eosinophil % 0.1  Basophil % 1.0  Neutrophil #  24.8  Lymphocyte # 1.4  Monocyte #  1.1   Eosinophil # 0.0  Basophil #  0.3 (Result(s) reported on 24 Dec 2012 at 06:23AM.)   Assessment/Plan:  Assessment/Plan:  Assessment SBP and pneumonia?   Plan Continue Abx. Therapeutic paracentesis. Check ammonia level periodically. Dr. WAllen Norriswill see patient over the weekend. Thanks.   Electronic Signatures: OVerdie Shire(MD)  (Signed 17-Apr-14 12:50)  Authored: Chief Complaint, VITAL SIGNS/ANCILLARY NOTES, Brief Assessment, Lab Results, Assessment/Plan   Last Updated: 17-Apr-14 12:50 by OVerdie Shire(MD)

## 2014-12-30 NOTE — Consult Note (Signed)
Chief Complaint:  Subjective/Chief Complaint SBP with pneumonia. Patient reporting to feel better today. Now out of ICU.   VITAL SIGNS/ANCILLARY NOTES: **Vital Signs.:   18-Apr-14 12:24  Vital Signs Type Upon Transfer  Temperature Temperature (F) 99.5  Celsius 37.5  Temperature Source oral  Pulse Pulse 104  Respirations Respirations 18  Systolic BP Systolic BP 729  Diastolic BP (mmHg) Diastolic BP (mmHg) 77  Mean BP 91  Pulse Ox % Pulse Ox % 92  Pulse Ox Activity Level  At rest  Oxygen Delivery Room Air/ 21 %   Brief Assessment:  Respiratory normal resp effort   Additional Physical Exam A & O x 3 NAD   Lab Results: Hepatic:  18-Apr-14 04:21   Bilirubin, Total  1.1  Alkaline Phosphatase 100  SGPT (ALT) 14  SGOT (AST) 20  Total Protein, Serum  4.2  Albumin, Serum  1.8  Routine Chem:  18-Apr-14 04:21   Magnesium, Serum  0.8 (1.8-2.4 THERAPEUTIC RANGE: 4-7 mg/dL TOXIC: > 10 mg/dL  -----------------------)  Glucose, Serum 81  BUN  4  Creatinine (comp)  0.57  Sodium, Serum  130  Potassium, Serum  3.1  Chloride, Serum  96  CO2, Serum 28  Calcium (Total), Serum  7.2  Osmolality (calc) 257  eGFR (African American) >60  eGFR (Non-African American) >60 (eGFR values <72m/min/1.73 m2 may be an indication of chronic kidney disease (CKD). Calculated eGFR is useful in patients with stable renal function. The eGFR calculation will not be reliable in acutely ill patients when serum creatinine is changing rapidly. It is not useful in  patients on dialysis. The eGFR calculation may not be applicable to patients at the low and high extremes of body sizes, pregnant women, and vegetarians.)  Anion Gap  6  Routine Hem:  18-Apr-14 04:21   WBC (CBC)  26.3  RBC (CBC)  2.51  Hemoglobin (CBC)  9.0  Hematocrit (CBC)  27.0  Platelet Count (CBC) 209  MCV  108  MCH  35.8  MCHC 33.3  RDW  17.3  Neutrophil % 90.8  Lymphocyte % 5.0  Monocyte % 3.2  Eosinophil % 0.3  Basophil %  0.7  Neutrophil #  23.9  Lymphocyte # 1.3  Monocyte # 0.8  Eosinophil # 0.1  Basophil #  0.2 (Result(s) reported on 25 Dec 2012 at 05:05AM.)   Assessment/Plan:  Assessment/Plan:  Assessment SBP with pneumonia and increased WBC's   Plan Patient doing better. Eating well. No complaints. WBC's slightly down today. Continue current treatment.   Electronic Signatures: WLucilla Lame(MD)  (Signed 18-Apr-14 12:53)  Authored: Chief Complaint, VITAL SIGNS/ANCILLARY NOTES, Brief Assessment, Lab Results, Assessment/Plan   Last Updated: 18-Apr-14 12:53 by WLucilla Lame(MD)

## 2015-02-16 IMAGING — US ABDOMEN ULTRASOUND LIMITED
1 series · 14 of 25 positions shown · non-contrast
Comparison: none

REASON FOR EXAM: elevated lfts, elevated lipase, post chole 3 months ag
COMMENTS:   Body Site: GB and Fossa, CBD, Head of Pancreas

PROCEDURE:     US  - US ABDOMEN LIMITED SURVEY  - November 17, 2012  [DATE]
RESULT:     Comparison: 05/17/2012
TECHNIQUE: Multiple grayscale and color Doppler images were obtained of the
right upper quadrant.

[Series 1: abdomen ultrasound limited · 0.28mm/px · 14 of 50 slices shown]
[im 1/50]
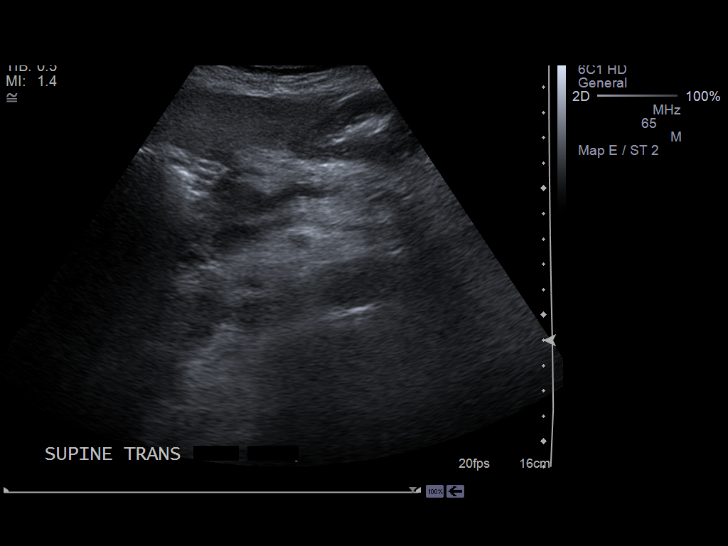
[im 5/50]
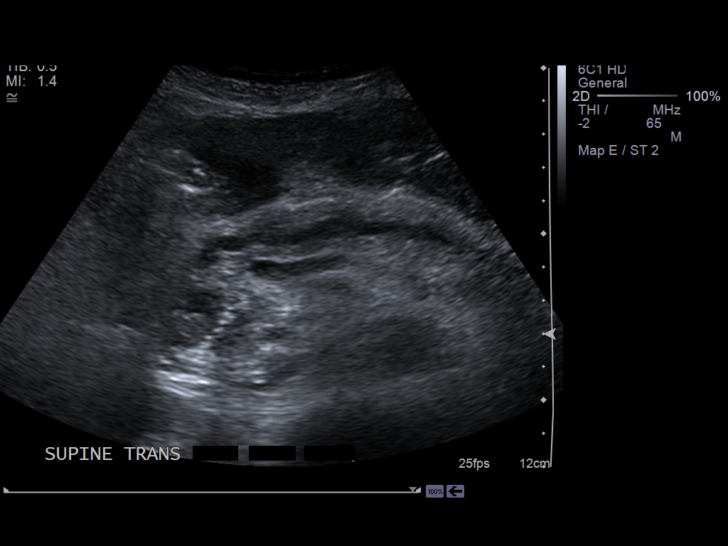
[im 9/50]
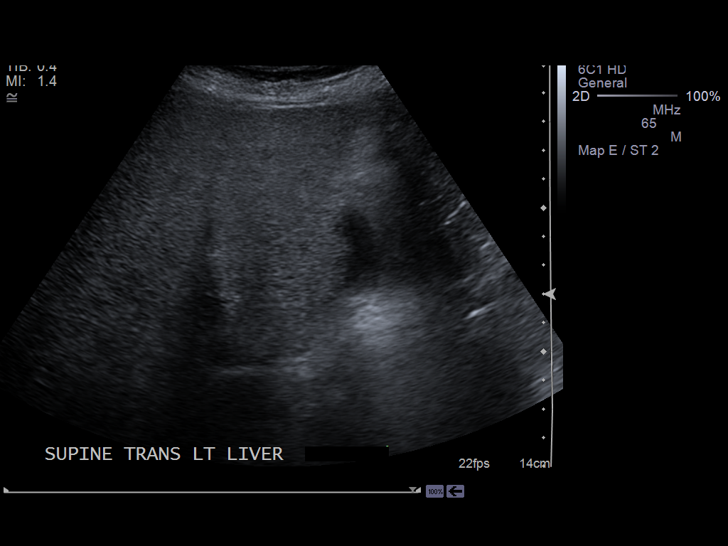
[im 13/50]
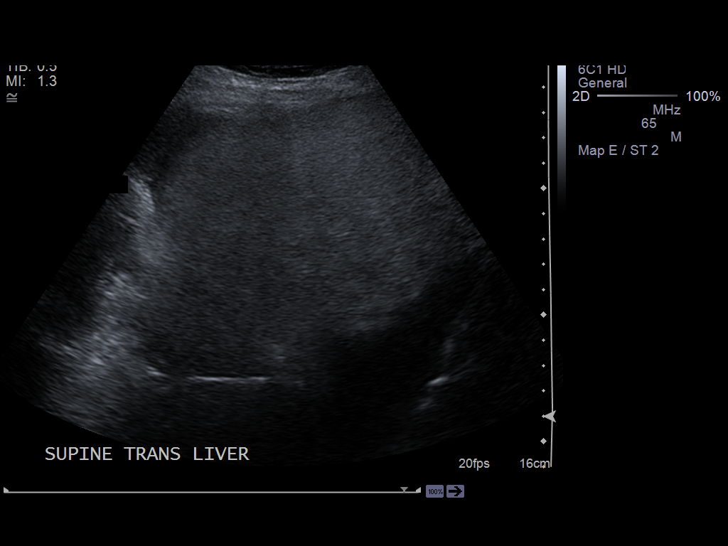
[im 17/50]
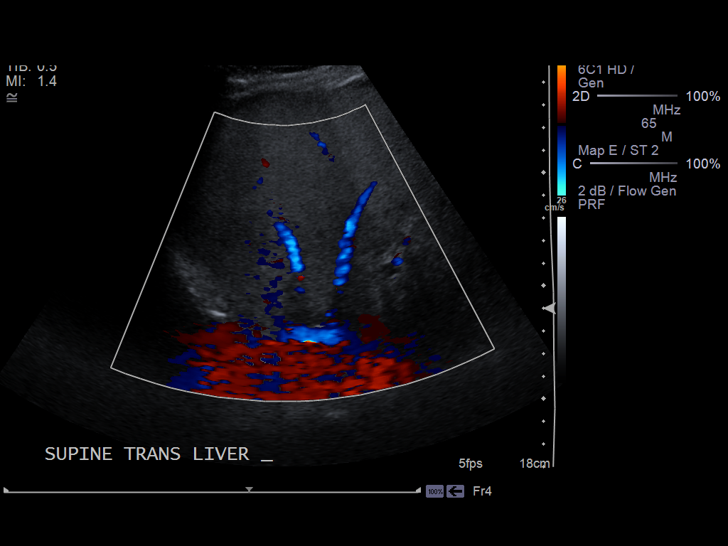
[im 19/50]
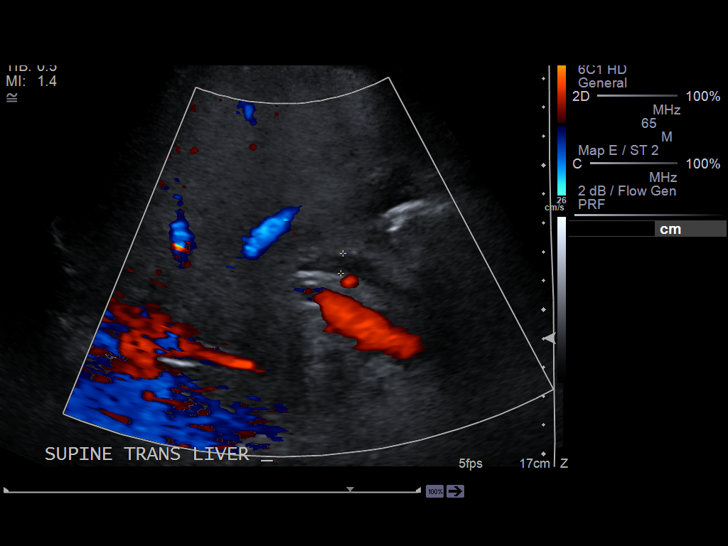
[im 23/50]
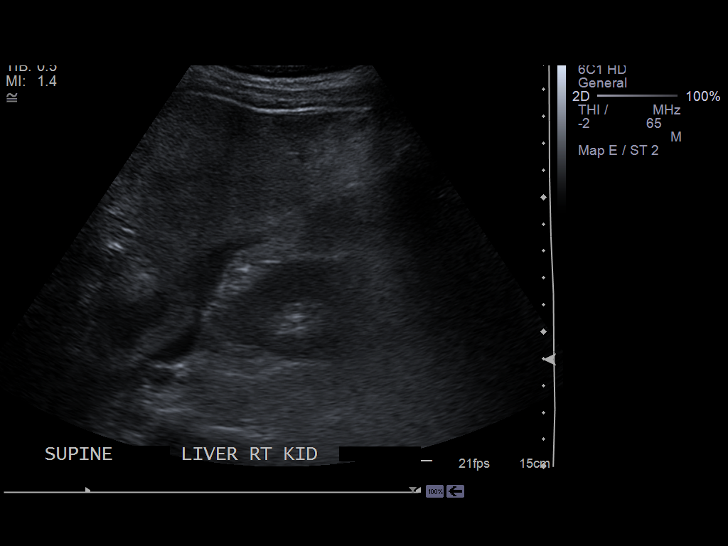
[im 27/50]
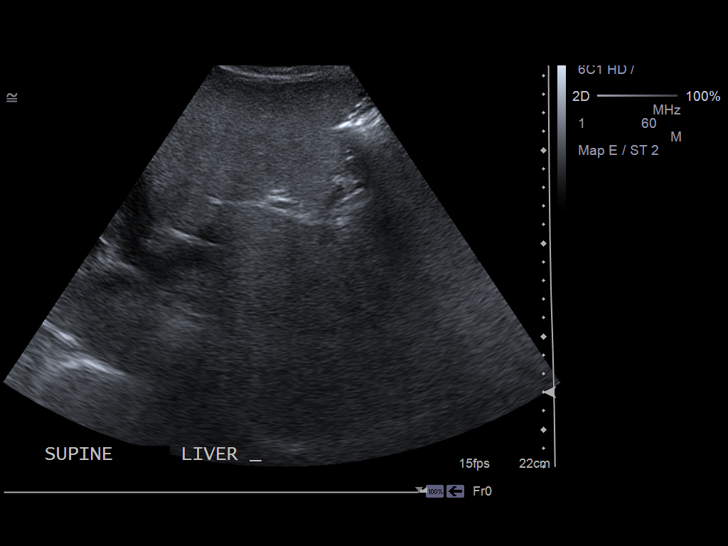
[im 31/50]
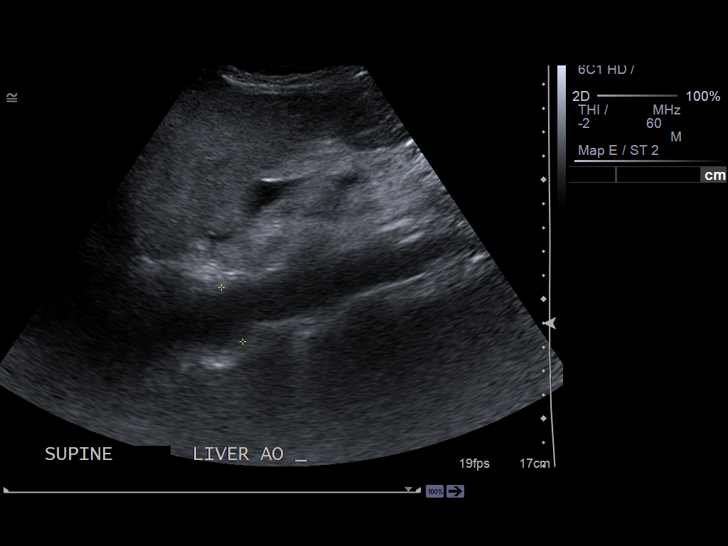
[im 33/50]
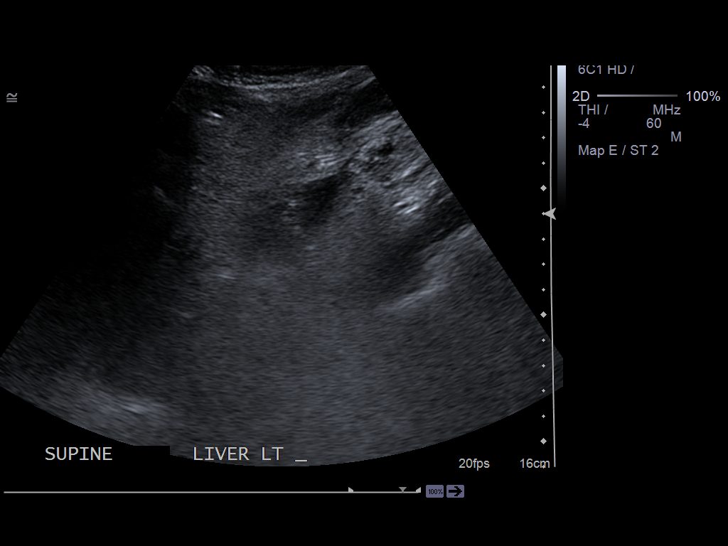
[im 37/50]
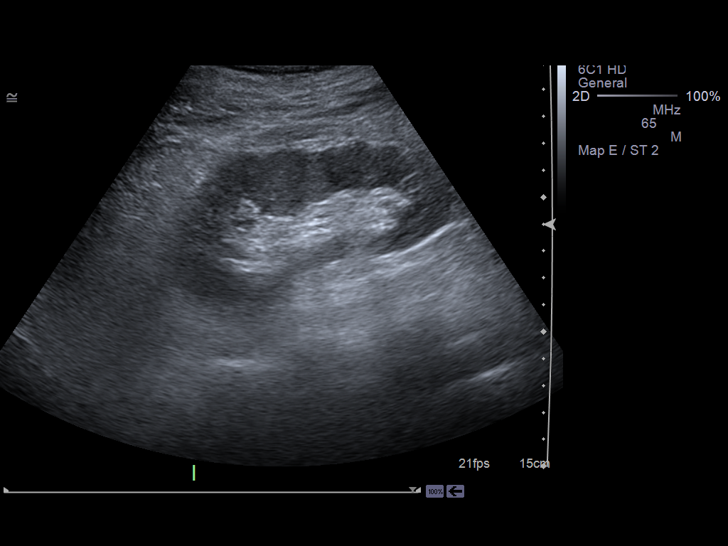
[im 41/50]
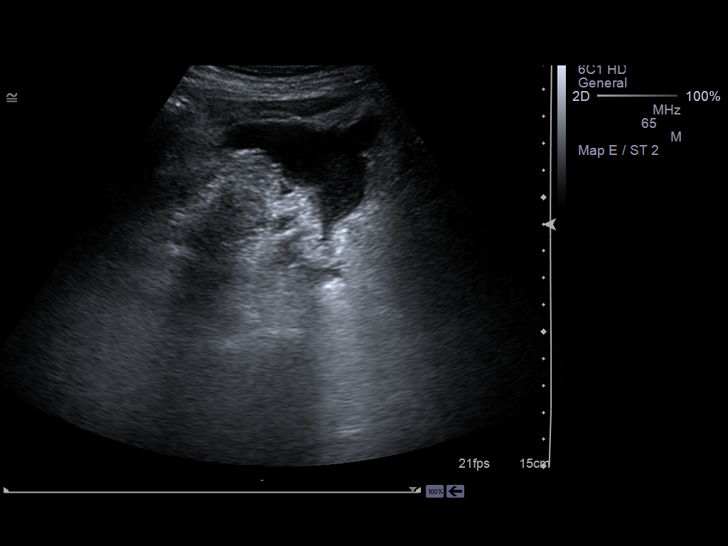
[im 45/50]
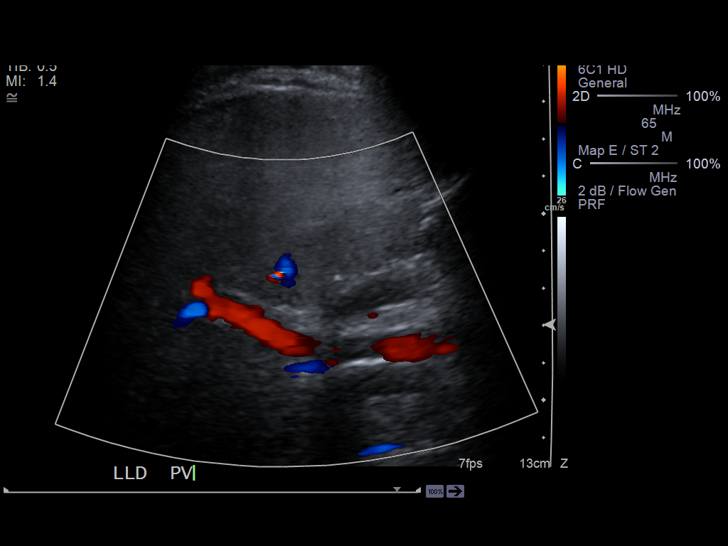
[im 50/50]
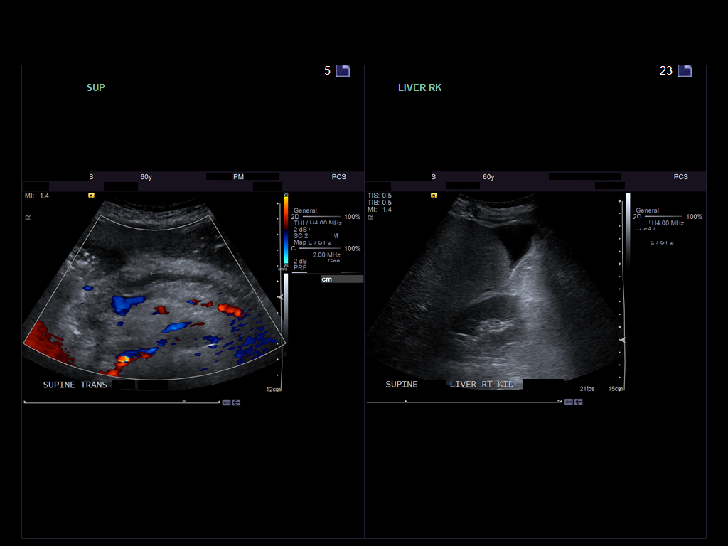

[14 of 25 positions shown; findings below may reference images not displayed]

FINDINGS: The pancreatic duct is dilated, measuring 5-6 mm in diameter. The pancreatic
head is mostly obscured by overlying bowel gas.

The liver is echogenic and dense, as can be seen with hepatic steatosis.
There is a small amount of ascites adjacent to the liver. The common bile
duct measures 7-8 mm in diameter. The patient is status post cholecystectomy.
IMPRESSION: 1. The pancreatic duct is dilated. This can be related to changes of
pancreatitis, an obstructing lesion, or etiologies such as intraductal
papillary mucinous neoplasm (IPMN). Further evaluation could be provided
with contrast enhanced CT.
2. Hepatic steatosis.
3. Mild amount of ascites.
4. The common bile duct is mildly dilated. This may be related to prior
cholecystectomy. However, clinical relation is recommended.

## 2015-02-16 IMAGING — CT CT ABD-PELV W/ CM
1 of 2 series · 14 of 32 positions shown, 18 images · IV contrast (isovue)
Comparison: none

REASON FOR EXAM: (1) abd pain; (2) abd pain
COMMENTS:

PROCEDURE:     CT  - CT ABDOMEN / PELVIS  W  - November 17, 2012  [DATE]
RESULT:     Comparison:  07/20/2012, 05/17/2012
TECHNIQUE: Multiple axial images of the abdomen and pelvis were performed
from the lung bases to the pubic symphysis, with p.o. contrast and with 100
mL of Isovue 300 intravenous contrast.

[Series 2: 3mm soft tissue · axial · 0.68mm/px · z∈[-402,-28]mm · 14 of 143 slices shown, 18 images]
[im 12/143  soft-tissue]
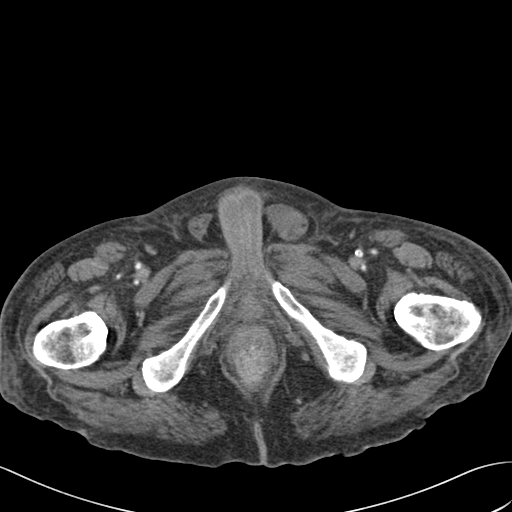
[im 12/143  bone]
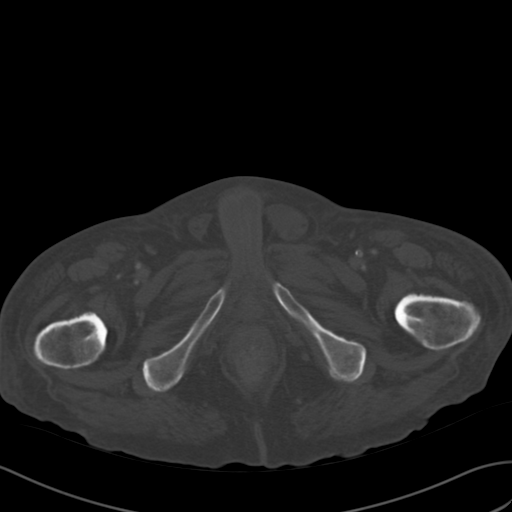
[im 23/143  soft-tissue]
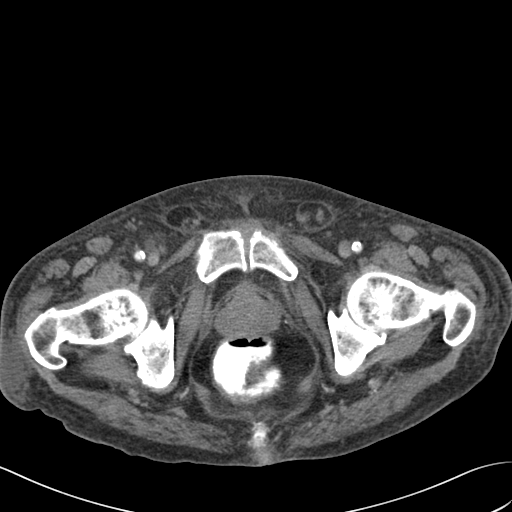
[im 35/143  soft-tissue]
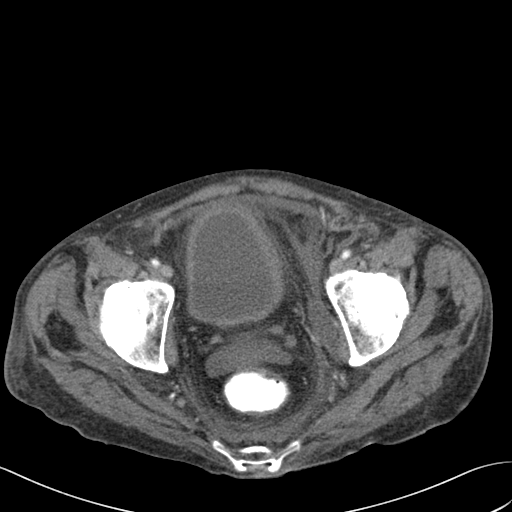
[im 46/143  soft-tissue]
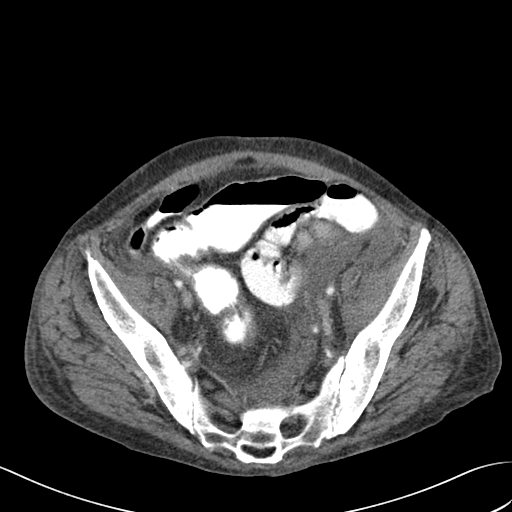
[im 57/143  soft-tissue]
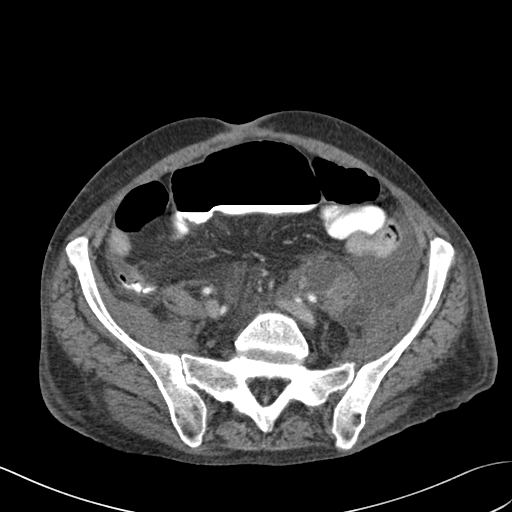
[im 69/143  soft-tissue]
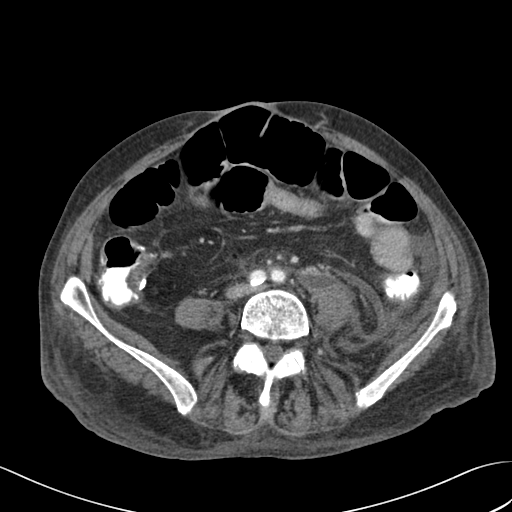
[im 80/143  soft-tissue]
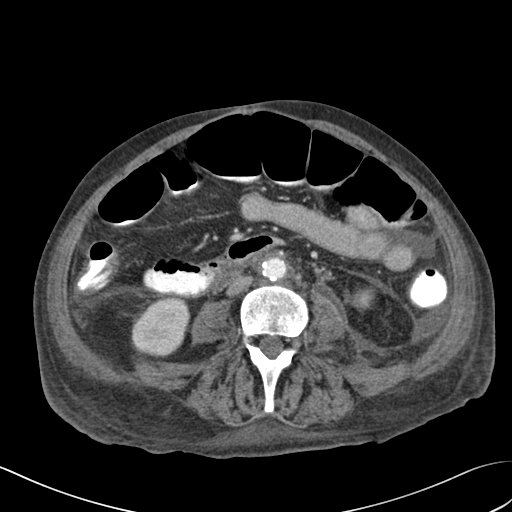
[im 91/143  soft-tissue]
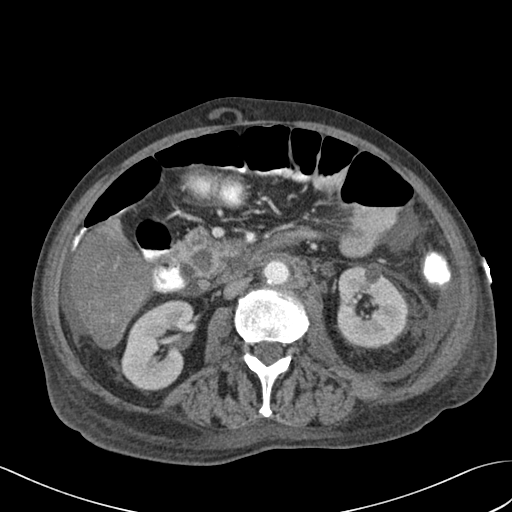
[im 103/143  soft-tissue]
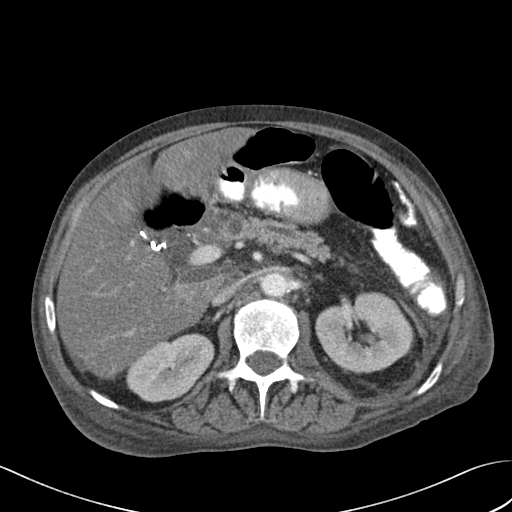
[im 103/143  bone]
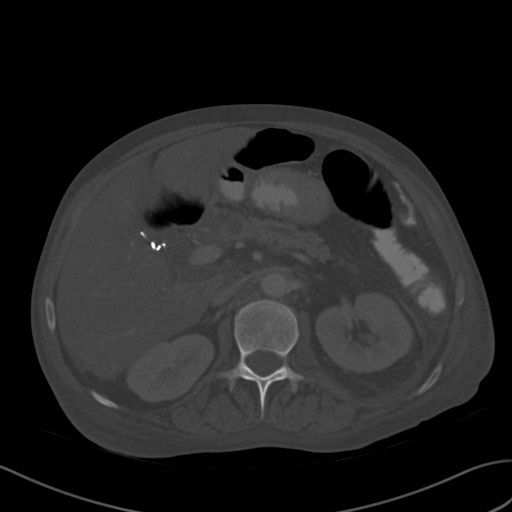
[im 114/143  soft-tissue]
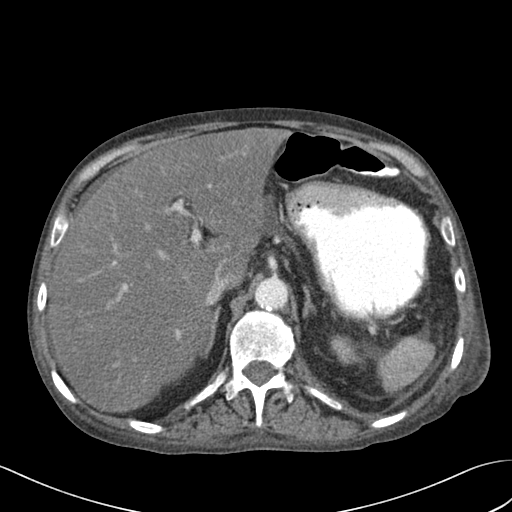
[im 120/143  lung]
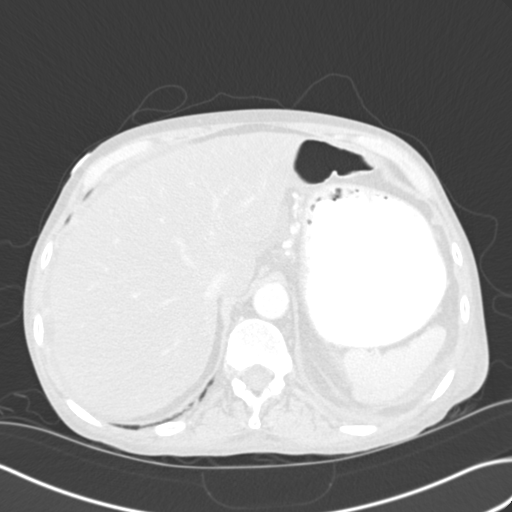
[im 125/143  soft-tissue]
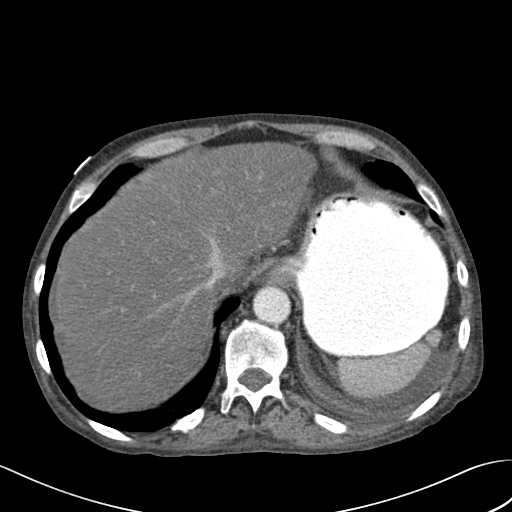
[im 125/143  lung]
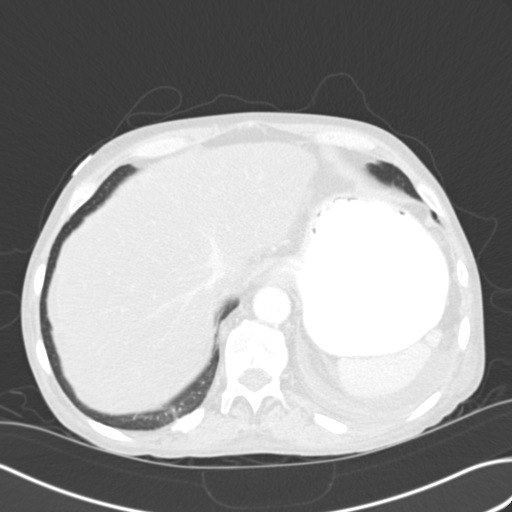
[im 131/143  lung]
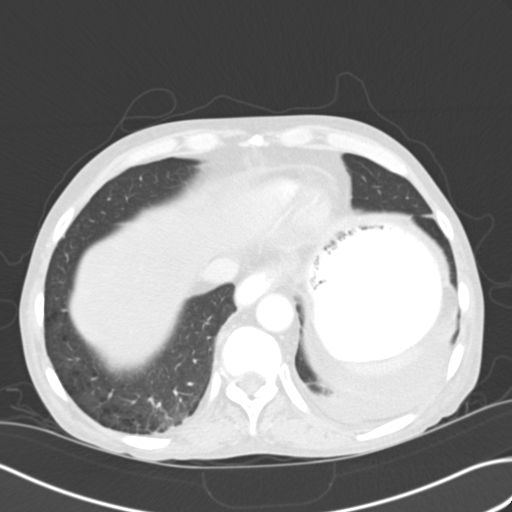
[im 137/143  soft-tissue]
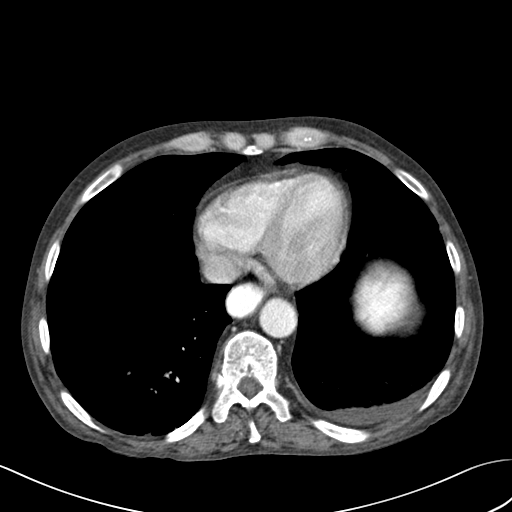
[im 137/143  lung]
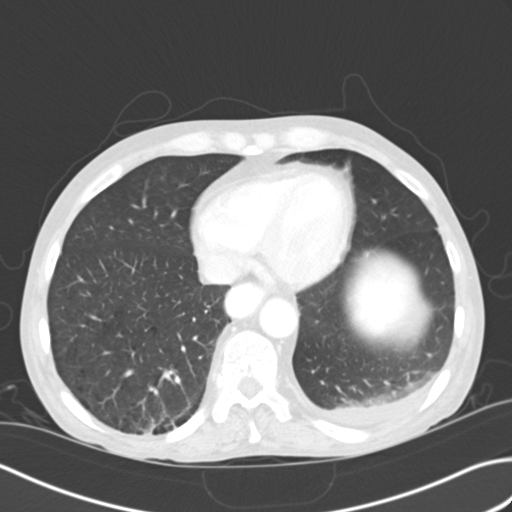

[14 of 32 positions shown; findings below may reference images not displayed]

FINDINGS: There is a trace left pleural effusion.

The liver is diffusely low in attenuation. Surgical clips are seen from
prior cholecystectomy. There is a small amount of perihepatic and
perisplenic fluid. The spleen and adrenals are unremarkable. Small
low-attenuation lesions in the kidneys are too small to characterize. There
is wall thickening of the gastric body, which is nonspecific. The main
pancreatic duct is dilated. There are multiple low-attenuation lesions
within the pancreatic head and body. The largest measures 17 mm in diameter
in the region of the pancreatic head. There is fluid extending inferiorly
along the left psoas muscle as well as along the left lateral conal fascia
and the left hemiabdomen. There is some fluid extending inferiorly adjacent
to the aorta. This may related to changes of pancreatitis. There is some
enhancement surrounding the fluid adjacent to the aorta and left psoas
muscle, which is nonspecific.

The small and large bowel are normal in caliber. A bowel suture line is seen
at the base of the cecum. There is a small amount of fluid along the right
side of the pelvis. There is some low-attenuation along the left spermatic
cord which is new from prior. This could related to some proteinaceous
fluid. However, clinical correlation is recommended.

No aggressive lytic or sclerotic osseous lesions are identified.
IMPRESSION: 1. There are multiple small cystic lesions in the pancreatic head and
pancreatic body. These could represent multiple pseudocysts given the prior
pancreatitis. One or more pancreatic cystic neoplasms would be of
differential consideration. Further evaluation could be provided with
endoscopic ultrasound. At a minimum, followup contrast-enhanced CT is
recommended.
2. There is wall thickening of the gastric body. This may be in part be
related to underdistention. However, gastritis or an infiltrative disorder
cannot be excluded. Further evaluation could be provided with endoscopy.
3. Fluid extending inferiorly along the left psoas muscle, adjacent to the
aorta, and in the left hemiabdomen is nonspecific, but may related to
changes of pancreatitis. Clinical correlation is recommended.
4. Other findings as above.

[REDACTED]

## 2015-02-16 IMAGING — CR DG CHEST 2V
1 series · 2 of 2 positions shown · non-contrast
Comparison: none

REASON FOR EXAM: elevated wbc, sob
COMMENTS:

[Series 1: x chest ap · 0.14mm/px · 2 of 2 slices shown]
[im 1/2]
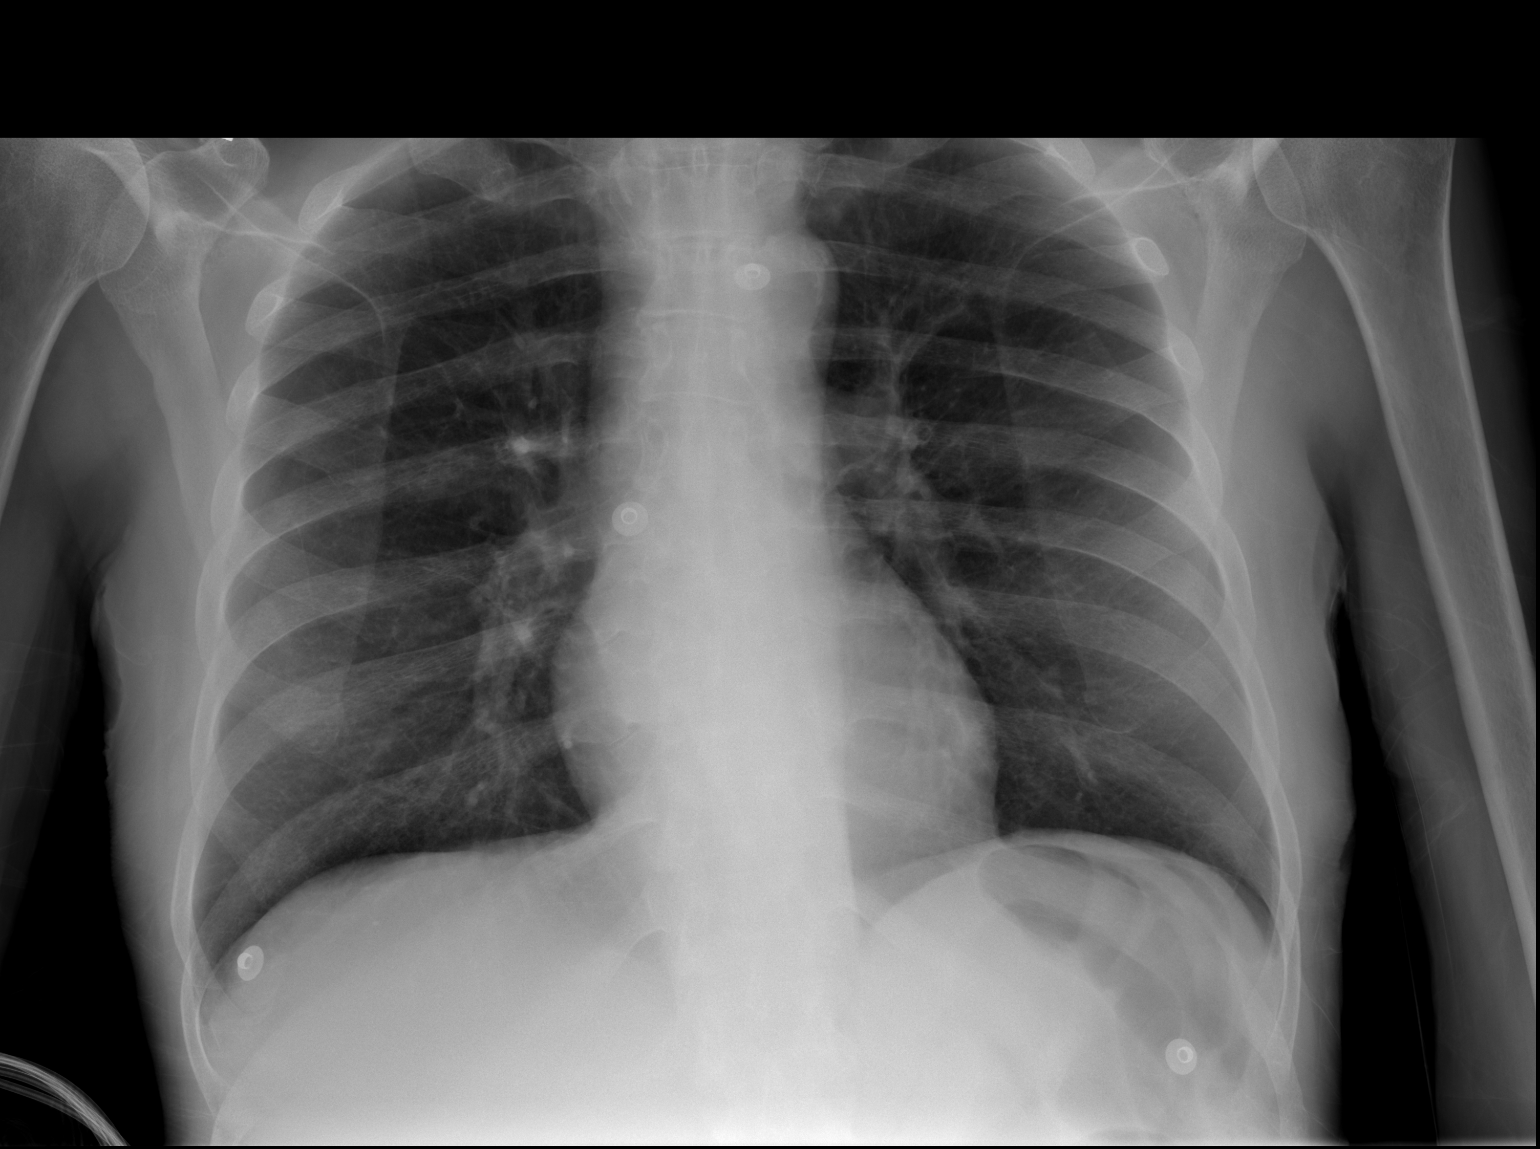
[im 2/2]
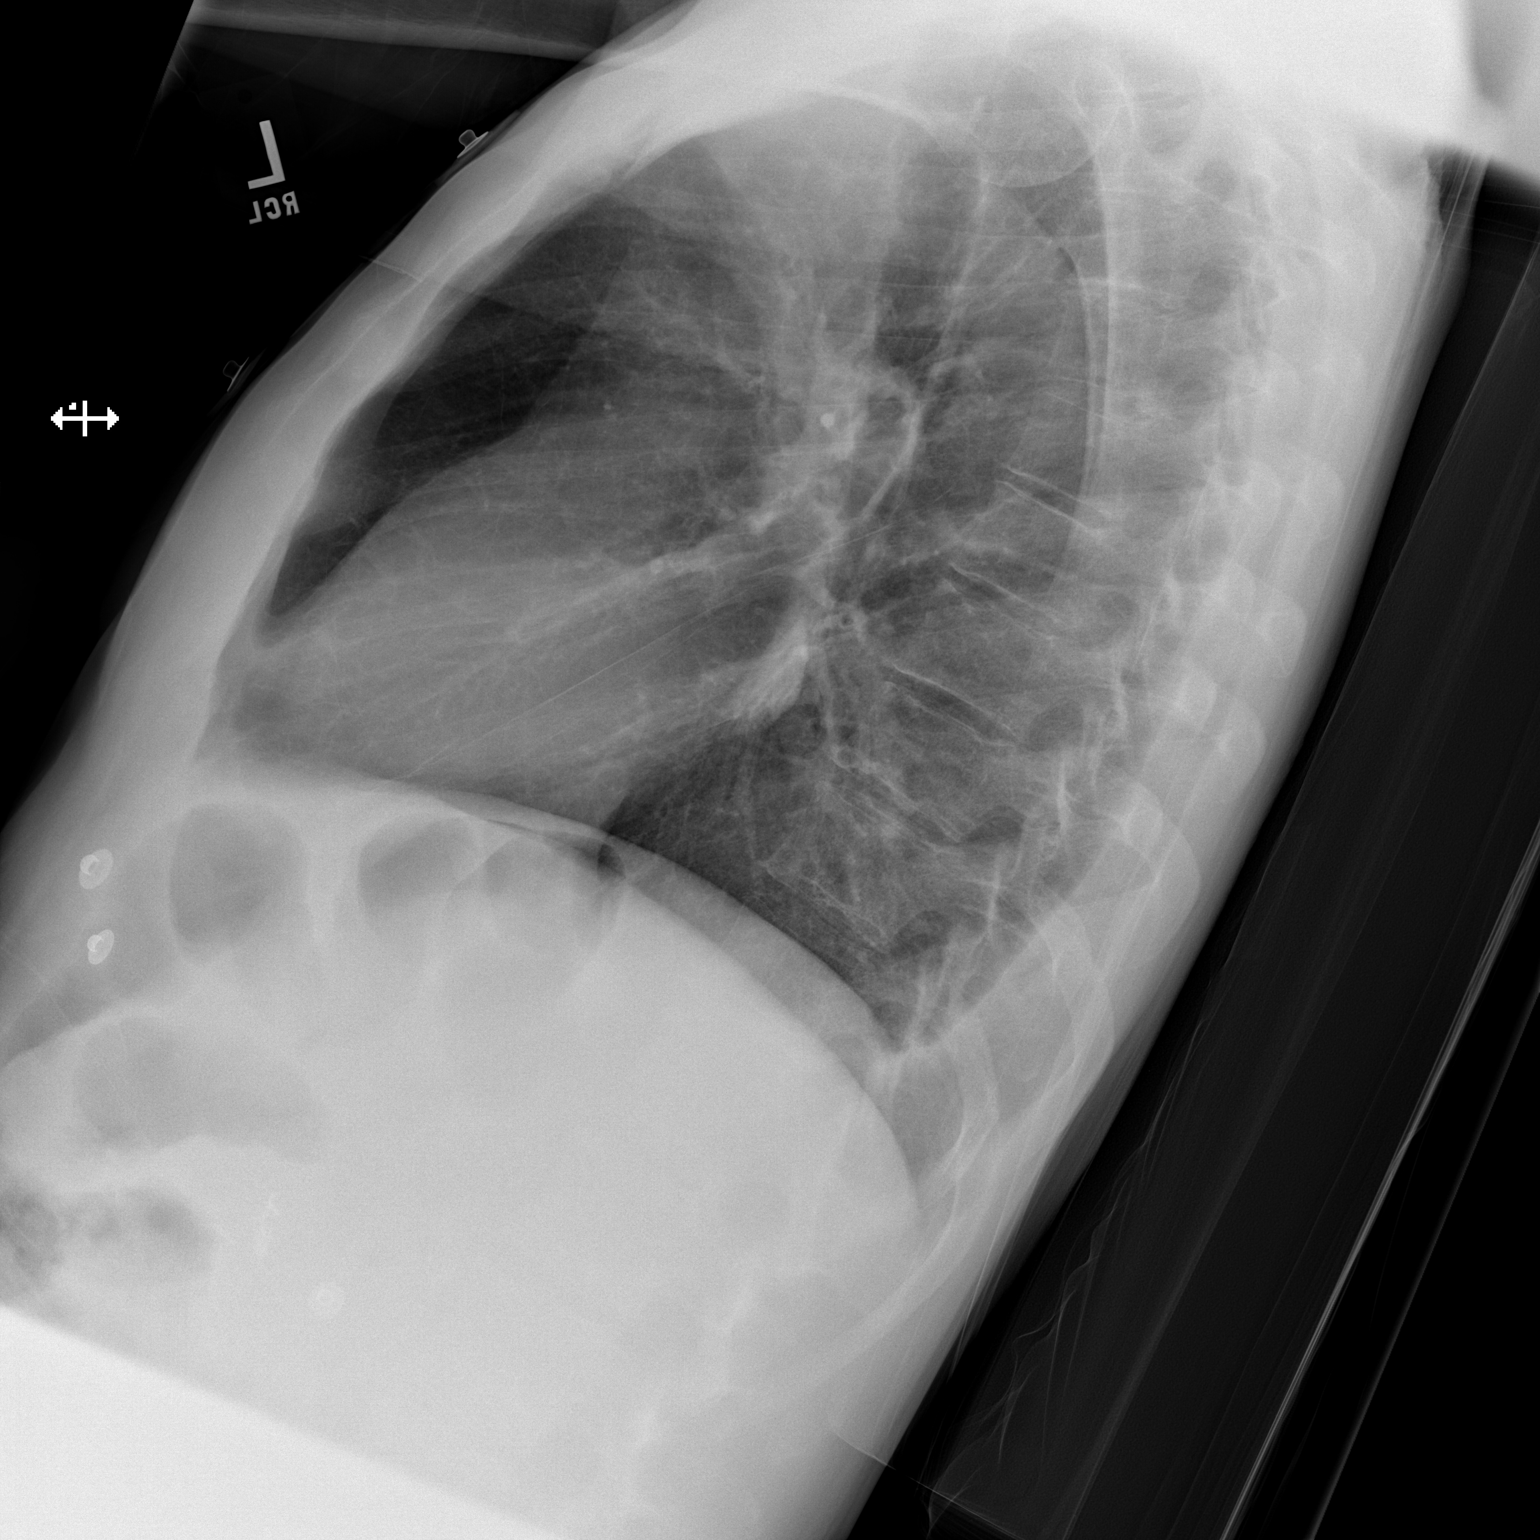

[2 of 2 positions shown; findings below may reference images not displayed]

PROCEDURE:     DXR - DXR CHEST PA (OR AP) AND LATERAL  - November 17, 2012  [DATE]

RESULT:     There is no previous exam for comparison.

The lungs are clear. The heart and pulmonary vessels are normal. The bony
and mediastinal structures are unremarkable. There is no effusion. There is
no pneumothorax or evidence of congestive failure.
IMPRESSION: No acute cardiopulmonary disease.

[REDACTED]
